# Patient Record
Sex: Female | Born: 1995 | Race: Black or African American | Hispanic: No | Marital: Single | Smoking: Light tobacco smoker
Health system: Southern US, Community
[De-identification: ages and names within clinical notes are randomized; demographics above are authoritative.]

## PROBLEM LIST (undated history)

## (undated) DIAGNOSIS — N926 Irregular menstruation, unspecified: Secondary | ICD-10-CM

## (undated) DIAGNOSIS — F32A Depression, unspecified: Secondary | ICD-10-CM

## (undated) DIAGNOSIS — F4321 Adjustment disorder with depressed mood: Secondary | ICD-10-CM

## (undated) DIAGNOSIS — N739 Female pelvic inflammatory disease, unspecified: Secondary | ICD-10-CM

## (undated) DIAGNOSIS — K5909 Other constipation: Secondary | ICD-10-CM

## (undated) DIAGNOSIS — L732 Hidradenitis suppurativa: Secondary | ICD-10-CM

## (undated) DIAGNOSIS — F329 Major depressive disorder, single episode, unspecified: Secondary | ICD-10-CM

## (undated) HISTORY — DX: Irregular menstruation, unspecified: N92.6

## (undated) HISTORY — DX: Depression, unspecified: F32.A

## (undated) HISTORY — DX: Hidradenitis suppurativa: L73.2

## (undated) HISTORY — DX: Other constipation: K59.09

## (undated) HISTORY — DX: Female pelvic inflammatory disease, unspecified: N73.9

## (undated) HISTORY — DX: Adjustment disorder with depressed mood: F43.21

## (undated) HISTORY — DX: Major depressive disorder, single episode, unspecified: F32.9

---
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
## 2016-07-05 ENCOUNTER — Ambulatory Visit: Payer: Medicaid Other | Admitting: Pediatrics

## 2016-08-13 ENCOUNTER — Ambulatory Visit: Payer: Medicaid Other | Admitting: Pediatrics

## 2016-09-14 ENCOUNTER — Encounter (HOSPITAL_COMMUNITY): Payer: Self-pay | Admitting: *Deleted

## 2016-09-14 ENCOUNTER — Emergency Department (HOSPITAL_COMMUNITY)
Admission: EM | Admit: 2016-09-14 | Discharge: 2016-09-14 | Disposition: A | Discharge status: Home / Self Care | Payer: Medicaid Other | Attending: Emergency Medicine | Admitting: Emergency Medicine

## 2016-09-14 DIAGNOSIS — Y939 Activity, unspecified: Secondary | ICD-10-CM | POA: Insufficient documentation

## 2016-09-14 DIAGNOSIS — F17200 Nicotine dependence, unspecified, uncomplicated: Secondary | ICD-10-CM | POA: Insufficient documentation

## 2016-09-14 DIAGNOSIS — K0889 Other specified disorders of teeth and supporting structures: Secondary | ICD-10-CM | POA: Insufficient documentation

## 2016-09-14 DIAGNOSIS — K0510 Chronic gingivitis, plaque induced: Secondary | ICD-10-CM

## 2016-09-14 DIAGNOSIS — Y998 Other external cause status: Secondary | ICD-10-CM | POA: Diagnosis not present

## 2016-09-14 DIAGNOSIS — S0993XA Unspecified injury of face, initial encounter: Secondary | ICD-10-CM | POA: Diagnosis present

## 2016-09-14 DIAGNOSIS — S0083XA Contusion of other part of head, initial encounter: Secondary | ICD-10-CM

## 2016-09-14 DIAGNOSIS — K0500 Acute gingivitis, plaque induced: Secondary | ICD-10-CM | POA: Diagnosis not present

## 2016-09-14 DIAGNOSIS — Y92410 Unspecified street and highway as the place of occurrence of the external cause: Secondary | ICD-10-CM | POA: Diagnosis not present

## 2016-09-14 NOTE — ED Notes (Signed)
Pt left prior to receiving d/c instructions and having VS rechecked. Pt was ambulatory, stable and NAD.
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
## 2016-12-12 ENCOUNTER — Ambulatory Visit: Payer: Self-pay | Admitting: Pediatrics

## 2016-12-26 ENCOUNTER — Encounter: Payer: Self-pay | Admitting: Family

## 2016-12-26 ENCOUNTER — Encounter: Payer: Self-pay | Admitting: *Deleted

## 2016-12-26 ENCOUNTER — Ambulatory Visit (INDEPENDENT_AMBULATORY_CARE_PROVIDER_SITE_OTHER): Payer: Medicaid Other | Admitting: Family

## 2016-12-26 ENCOUNTER — Other Ambulatory Visit: Payer: Self-pay

## 2016-12-26 VITALS — BP 122/78 | HR 80 | Ht 67.72 in | Wt 246.8 lb

## 2016-12-26 DIAGNOSIS — Z113 Encounter for screening for infections with a predominantly sexual mode of transmission: Secondary | ICD-10-CM | POA: Diagnosis not present

## 2016-12-26 DIAGNOSIS — Z3046 Encounter for surveillance of implantable subdermal contraceptive: Secondary | ICD-10-CM | POA: Diagnosis not present

## 2016-12-26 DIAGNOSIS — Z30017 Encounter for initial prescription of implantable subdermal contraceptive: Secondary | ICD-10-CM | POA: Diagnosis not present

## 2016-12-26 MED ORDER — ETONOGESTREL 68 MG ~~LOC~~ IMPL
68.0000 mg | DRUG_IMPLANT | Freq: Once | SUBCUTANEOUS | Status: AC
  Administered 2016-12-26: 68 mg via SUBCUTANEOUS

## 2016-12-26 NOTE — Progress Notes (Signed)
THIS RECORD MAY CONTAIN CONFIDENTIAL INFORMATION THAT SHOULD NOT BE RELEASED WITHOUT REVIEW OF THE SERVICE PROVIDER.  Adolescent Medicine Consultation Follow-Up Visit Megan Simmons  is a 21 y.o. female referred by Rockney Gheearnell, Elizabeth, MD here today for follow-up regarding nexplanon removal and reinsertion.    Last seen in [facility] on 10/21/ for prediabetes and nexplanon in place.  Pertinent Labs? No Growth Chart Viewed? no   History was provided by the patient.  Interpreter? no  PCP Confirmed?  yes  My Chart Activated?   no    Chief Complaint  Patient presents with  . Follow-up    HPI:    11/26/13 insertion of nexplanon.  Returns today for resinsertion.  Likes device.  Had some issues with BTB at first with device.  No vaginal discharge changes, no lesions, no dyspareunia at present.   Review of Systems  Constitutional: Negative for malaise/fatigue.  Eyes: Negative for double vision.  Respiratory: Negative for shortness of breath.   Cardiovascular: Negative for chest pain and palpitations.  Gastrointestinal: Negative for abdominal pain, constipation, diarrhea, nausea and vomiting.  Genitourinary: Negative for dysuria.  Musculoskeletal: Negative for joint pain and myalgias.  Skin: Negative for rash.  Neurological: Negative for dizziness and headaches.  Endo/Heme/Allergies: Does not bruise/bleed easily.    Patient's last menstrual period was 12/25/2016 (exact date). No Known Allergies Outpatient Medications Prior to Visit  Medication Sig Dispense Refill  . etonogestrel (NEXPLANON) 68 MG IMPL implant 1 each by Subdermal route once.    . chlorhexidine (HIBICLENS) 4 % external liquid Reported on 06/23/2015    . clindamycin (CLEOCIN T) 1 % external solution Reported on 06/23/2015  5  . DIFFERIN 0.1 % cream APPLY A THIN LAYER TO THE FACE NIGHTLY.  3  . doxycycline (VIBRAMYCIN) 100 MG capsule Take 100 mg by mouth 2 (two) times daily. Reported on 06/23/2015   5   No facility-administered medications prior to visit.      Patient Active Problem List   Diagnosis Date Noted  . Obesity 06/23/2015  . Hx: UTI (urinary tract infection) 06/23/2015  . Surveillance of implantable subdermal contraceptive 08/19/2014  . Vitamin D deficiency 03/26/2014  . Depressed mood 03/26/2014  . Hidradenitis suppurativa 10/07/2013  . Acne 12/16/2012    Physical Exam:  Vitals:   12/26/16 0952  BP: 122/78  Pulse: 80  Weight: 246 lb 12.8 oz (111.9 kg)  Height: 5' 7.72" (1.72 m)   BP 122/78 (BP Location: Left Arm, Patient Position: Sitting, Cuff Size: Large)   Pulse 80   Ht 5' 7.72" (1.72 m)   Wt 246 lb 12.8 oz (111.9 kg)   LMP 12/25/2016 (Exact Date)   BMI 37.84 kg/m  Body mass index: body mass index is 37.84 kg/m. Growth percentile SmartLinks can only be used for patients less than 20 years old.  Wt Readings from Last 3 Encounters:  12/26/16 246 lb 12.8 oz (111.9 kg)  06/23/15 259 lb 3.2 oz (117.6 kg) (>99 %, Z= 2.53)*  05/04/15 255 lb 14.4 oz (116.1 kg) (>99 %, Z= 2.50)*   * Growth percentiles are based on CDC (Girls, 2-20 Years) data.    Physical Exam  Constitutional: She appears well-developed. No distress.  HENT:  Head: Normocephalic and atraumatic.  Eyes: EOM are normal. Pupils are equal, round, and reactive to light. No scleral icterus.  Neck: Normal range of motion. Neck supple. No thyromegaly present.  Cardiovascular: Normal rate and regular rhythm.  No murmur heard. Pulmonary/Chest: Effort normal and  breath sounds normal.  Musculoskeletal: Normal range of motion. She exhibits no edema.  Lymphadenopathy:    She has no cervical adenopathy.  Neurological: She is alert. No cranial nerve deficit.  Skin: Skin is warm and dry. No rash noted.  Significant AN on neck and axillary folds  Psychiatric: She has a normal mood and affect.  Nursing note reviewed.  Assessment/Plan: 1. Encounter for Nexplanon removal Risks & benefits of  Nexplanon removal discussed. Consent form signed.  The patient denies any allergies to anesthetics or antiseptics.  Procedure: Pt was placed in supine position. right arm was flexed at the elbow and externally rotated so that her wrist was parallel to her ear, The device was palpated and marked. The site was cleaned with Betadine. The area surrounding the device was covered with a sterile drape. 1% lidocaine was injected just under the device. A scalpel was used to create a small incision. The device was pushed towards the incision. Fibrous tissue surrounding the device was gradually removed from the device. The device was removed and measured to ensure all 4 cm of device was removed. Steri-strips were used to close the incision. Pressure dressing was applied to the patient.  The patient was instructed to removed the pressure dressing in 24 hrs.  The patient was advised to move slowly from a supine to an upright position  The patient denied any concerns or complaints  The patient was instructed to schedule a follow-up appt in 1 month. The patient will be called in 1 week to address any concerns.  2. Nexplanon insertion See procedure note.  - Subdermal Etonogestrel Implant Insertion  3. Routine screening for STI (sexually transmitted infection) Per protocol - C. trachomatis/N. gonorrhoeae RNA   Follow-up:   One month s/p nexplanon insertion with Christianne Dolinhristy Millican, FNP-C.   Medical decision-making:  >15 minutes spent face to face with patient with more than 50% of appointment spent discussing diagnosis, management, follow-up, and reviewing removal and reinsertion procedures, bleeding profiles, and return precautions.

## 2016-12-26 NOTE — Procedures (Signed)
Nexplanon Insertion  No contraindications for placement.  No liver disease, no unexplained vaginal bleeding, no h/o breast cancer, no h/o blood clots.  Patient's last menstrual period was 12/25/2016 (exact date).  UHCG: negative  Last Unprotected sex:  N/A  Risks & benefits of Nexplanon discussed The nexplanon device was [facility] and [facility] Packaging instructions supplied to patient Consent form signed  The patient denies any allergies to anesthetics or antiseptics.  Procedure: Pt was placed in supine position. The right arm was flexed at the elbow and externally rotated so that her wrist was parallel to her ear The medial epicondyle of the right arm was identified The insertions site was marked 8 cm proximal to the medial epicondyle The insertion site was cleaned with Betadine The area surrounding the insertion site was covered with a sterile drape 1% lidocaine was injected just under the skin at the insertion site extending 4 cm proximally. The sterile preloaded disposable Nexaplanon applicator was removed from the sterile packaging The applicator needle was inserted at a 30 degree angle at 8 cm proximal to the medial epicondyle as marked The applicator was lowered to a horizontal position and advanced just under the skin for the full length of the needle The slider on the applicator was retracted fully while the applicator remained in the same position, then the applicator was removed. The implant was confirmed via palpation as being in position The implant position was demonstrated to the patient Pressure dressing was applied to the patient.  The patient was instructed to removed the pressure dressing in 24 hrs.  The patient was advised to move slowly from a supine to an upright position  The patient denied any concerns or complaints  The patient was instructed to schedule a follow-up appt in 1 month and to call sooner if any concerns.  The patient  acknowledged agreement and understanding of the plan.

## 2016-12-26 NOTE — Patient Instructions (Signed)
   Your Nexplanon was removed today and is no longer preventing pregnancy.  If you have sex, remember to use condoms to prevent pregnancy and to prevent sexually transmitted infections.  Leave the outside bandage on for 24 hours.  Leave the smaller bandages on for 3-5 days or until they fall off on their own.  Keep the area clean and dry for 3-5 days.  There is usually bruising or swelling at and around the removal site for a few days to a week after the removal.  If you see redness or pus draining from the removal site, call us immediately.  We would like you to return to the clinic for a follow-up visit in 1 month.  You can call [facility] 24 hours a day with any questions or concerns.  There is always a nurse or doctor available to take your call.  Call 9-1-1 if you have a life-threatening emergency.  For anything else, please call us at [phone] before heading to the ER.    Congratulations on getting your Nexplanon placement!  Below is some important information about Nexplanon.  First remember that Nexplanon does not prevent sexually transmitted infections.  Condoms will help prevent sexually transmitted infections. The Nexplanon starts working 7 days after it was inserted.  There is a risk of getting pregnant if you have unprotected sex in those first 7 days after placement of the Nexplanon.  The Nexplanon lasts for 3 years but can be removed at any time.  You can become pregnant as early as 1 week after removal.  You can have a new Nexplanon put in after the old one is removed if you like.  It is not known whether Nexplanon is as effective in women who are very overweight because the studies did not include many overweight women.  Nexplanon interacts with some medications, including barbiturates, bosentan, carbamazepine, felbamate, griseofulvin, oxcarbazepine, phenytoin, rifampin, St. John's wort, topiramate, HIV medicines.  Please alert your doctor if you are on  any of these medicines.  Always tell other healthcare providers that you have a Nexplanon in your arm.  The Nexplanon was placed just under the skin.  Leave the outside bandage on for 24 hours.  Leave the smaller bandage on for 3-5 days or until it falls off on its own.  Keep the area clean and dry for 3-5 days. There is usually bruising or swelling at the insertion site for a few days to a week after placement.  If you see redness or pus draining from the insertion site, call us immediately.  Keep your user card with the date the implant was placed and the date the implant is to be removed.  The most common side effect is a change in your menstrual bleeding pattern.   This bleeding is generally not harmful to you but can be annoying.  Call or come in to see us if you have any concerns about the bleeding or if you have any side effects or questions.    We will call you in 1 week to check in and we would like you to return to the clinic for a follow-up visit in 1 month.  You can call [facility] 24 hours a day with any questions or concerns.  There is always a nurse or doctor available to take your call.  Call 9-1-1 if you have a life-threatening emergency.  For anything else, please call us at [phone] before heading to the ER. 

## 2016-12-27 LAB — C. TRACHOMATIS/N. GONORRHOEAE RNA
C. TRACHOMATIS RNA, TMA: NOT DETECTED
N. GONORRHOEAE RNA, TMA: NOT DETECTED

## 2017-01-18 ENCOUNTER — Ambulatory Visit: Payer: Medicaid Other | Admitting: Family

## 2017-02-08 ENCOUNTER — Ambulatory Visit: Payer: Medicaid Other | Admitting: Family

## 2017-02-11 ENCOUNTER — Ambulatory Visit (INDEPENDENT_AMBULATORY_CARE_PROVIDER_SITE_OTHER): Payer: Medicaid Other | Admitting: Pediatrics

## 2017-02-11 ENCOUNTER — Encounter: Payer: Self-pay | Admitting: Pediatrics

## 2017-02-11 VITALS — BP 129/77 | HR 103 | Ht 68.0 in | Wt 248.0 lb

## 2017-02-11 DIAGNOSIS — N898 Other specified noninflammatory disorders of vagina: Secondary | ICD-10-CM

## 2017-02-11 DIAGNOSIS — L732 Hidradenitis suppurativa: Secondary | ICD-10-CM

## 2017-02-11 DIAGNOSIS — Z113 Encounter for screening for infections with a predominantly sexual mode of transmission: Secondary | ICD-10-CM | POA: Diagnosis not present

## 2017-02-11 MED ORDER — CHLORHEXIDINE GLUCONATE 4 % EX LIQD: Freq: Every day | CUTANEOUS | 3 refills | Status: DC | PRN

## 2017-02-11 MED ORDER — CLINDAMYCIN PHOSPHATE 1 % EX SOLN: Freq: Two times a day (BID) | CUTANEOUS | 5 refills | Status: DC

## 2017-02-11 NOTE — Progress Notes (Signed)
THIS RECORD MAY CONTAIN CONFIDENTIAL INFORMATION THAT SHOULD NOT BE RELEASED WITHOUT REVIEW OF THE SERVICE PROVIDER.  Adolescent Medicine Consultation Follow-Up Visit Megan Simmons  is a 22 y.o. female referred by [name], MD here today for follow-up regarding nexplanon replacement.    Last seen in Adolescent Medicine Clinic on 12/26/16 for nexplanon remove and replace.  Plan at last visit included see procedure note.  Pertinent Labs? No Growth Chart Viewed? yes   History was provided by the patient.  Interpreter? no  PCP Confirmed?  yes  My Chart Activated?   no   Chief Complaint  Patient presents with  . Follow-up    HPI:   Having some itching at the site of insertion. Wondering when discoloration will go away.  Had sex a few weeks ago and had some significant pain and then "wondered if her uterus would fall out." had sex again with a different partner recently and it was not painful. She is afraid that perhaps she got an STI as first encounter was not protected. Requests pelvic exam today.   Review of Systems  Constitutional: Negative for malaise/fatigue.  Eyes: Negative for double vision.  Respiratory: Negative for shortness of breath.   Cardiovascular: Negative for chest pain and palpitations.  Gastrointestinal: Negative for abdominal pain, constipation, diarrhea, nausea and vomiting.  Genitourinary: Negative for dysuria.  Musculoskeletal: Negative for joint pain and myalgias.  Skin: Negative for rash.  Neurological: Negative for dizziness and headaches.  Endo/Heme/Allergies: Does not bruise/bleed easily.     No LMP recorded. Patient has had an implant. No Known Allergies Outpatient Medications Prior to Visit  Medication Sig Dispense Refill  . etonogestrel (NEXPLANON) 68 MG IMPL implant 1 each by Subdermal route once.    . chlorhexidine (HIBICLENS) 4 % external liquid Reported on 06/23/2015    . clindamycin (CLEOCIN T) 1 % external solution Reported  on 06/23/2015  5  . DIFFERIN 0.1 % cream APPLY A THIN LAYER TO THE FACE NIGHTLY.  3  . doxycycline (VIBRAMYCIN) 100 MG capsule Take 100 mg by mouth 2 (two) times daily. Reported on 06/23/2015  5   No facility-administered medications prior to visit.      Patient Active Problem List   Diagnosis Date Noted  . Obesity 06/23/2015  . Hx: UTI (urinary tract infection) 06/23/2015  . Surveillance of implantable subdermal contraceptive 08/19/2014  . Vitamin D deficiency 03/26/2014  . Depressed mood 03/26/2014  . Hidradenitis suppurativa 10/07/2013  . Acne 12/16/2012     The following portions of the patient's history were reviewed and updated as appropriate: allergies, current medications, past family history, past medical history, past social history, past surgical history and problem list.  Physical Exam:  Vitals:   02/11/17 1626  BP: 129/77  Pulse: (!) 103  Weight: 248 lb (112.5 kg)  Height: 5\' 8"  (1.727 m)   BP 129/77   Pulse (!) 103   Ht 5\' 8"  (1.727 m)   Wt 248 lb (112.5 kg)   BMI 37.71 kg/m  Body mass index: body mass index is 37.71 kg/m. Growth percentile SmartLinks can only be used for patients less than 55 years old.   Physical Exam  Constitutional: She appears well-developed. No distress.  HENT:  Mouth/Throat: Oropharynx is clear and moist.  Neck: No thyromegaly present.  Cardiovascular: Normal rate and regular rhythm.  No murmur heard. Pulmonary/Chest: Breath sounds normal.  Abdominal: Soft. She exhibits no mass. There is no tenderness. There is no guarding.  Genitourinary: Cervix exhibits  no motion tenderness and no discharge. Right adnexum displays no tenderness. Left adnexum displays no tenderness. No vaginal discharge found.  Musculoskeletal: She exhibits no edema.  Lymphadenopathy:    She has no cervical adenopathy.  Neurological: She is alert.  Skin: Skin is warm. No rash noted.  Psychiatric: She has a normal mood and affect.  Nursing note and vitals  reviewed.   Assessment/Plan: 1. Routine screening for STI (sexually transmitted infection) Per protocol. Some bumps likely from shaving but will ensure not HSV.  - C. trachomatis/N. gonorrhoeae RNA - Herpes simplex virus culture  2. Vaginal discharge Not very significant on exam but will swab and treat as necessary. No CMT. - WET PREP BY MOLECULAR PROBE  3. Hidradenitis suppurativa Refilled meds for hidradenitis. Has some lesions on inner thighs.  - clindamycin (CLEOCIN T) 1 % external solution; Apply topically 2 (two) times daily.  Dispense: 60 mL; Refill: 5 - chlorhexidine (HIBICLENS) 4 % external liquid; Apply topically daily as needed. Reported on 06/23/2015  Dispense: 473 mL; Refill: 3   Follow-up:  As needed   Medical decision-making:  >25 minutes spent face to face with patient with more than 50% of appointment spent discussing diagnosis, management, follow-up, and reviewing of vaginal discharge, painful intercourse, hidradenitis.

## 2017-02-11 NOTE — Patient Instructions (Signed)
Start using clindamycin lotion and cleanser again to spots on legs  I will call you tomorrow or Wednesday with results

## 2017-02-12 LAB — WET PREP BY MOLECULAR PROBE
Candida species: NOT DETECTED
MICRO NUMBER: 90116220
SPECIMEN QUALITY: ADEQUATE
TRICHOMONAS VAG: NOT DETECTED

## 2017-02-12 LAB — C. TRACHOMATIS/N. GONORRHOEAE RNA
C. trachomatis RNA, TMA: NOT DETECTED
N. GONORRHOEAE RNA, TMA: NOT DETECTED

## 2017-02-12 LAB — TIQ- AMBIGUOUS ORDER

## 2017-02-13 LAB — HERPES SIMPLEX VIRUS CULTURE
MICRO NUMBER: 90116236
SPECIMEN QUALITY: ADEQUATE

## 2018-09-20 ENCOUNTER — Other Ambulatory Visit: Payer: Self-pay

## 2018-09-20 ENCOUNTER — Inpatient Hospital Stay (HOSPITAL_COMMUNITY)
Admission: EM | Admit: 2018-09-20 | Discharge: 2018-09-22 | DRG: 983 | Disposition: A | Discharge status: Home / Self Care | Payer: Medicaid Other | Attending: Family Medicine | Admitting: Family Medicine

## 2018-09-20 ENCOUNTER — Observation Stay (HOSPITAL_COMMUNITY): Payer: Medicaid Other | Admitting: Anesthesiology

## 2018-09-20 ENCOUNTER — Encounter (HOSPITAL_COMMUNITY)
Admission: EM | Disposition: A | Discharge status: Home / Self Care | Payer: Self-pay | Source: Home / Self Care | Attending: Family Medicine

## 2018-09-20 ENCOUNTER — Emergency Department (HOSPITAL_COMMUNITY): Payer: Medicaid Other

## 2018-09-20 ENCOUNTER — Encounter (HOSPITAL_COMMUNITY): Payer: Self-pay

## 2018-09-20 DIAGNOSIS — M659 Synovitis and tenosynovitis, unspecified: Secondary | ICD-10-CM | POA: Diagnosis present

## 2018-09-20 DIAGNOSIS — Z818 Family history of other mental and behavioral disorders: Secondary | ICD-10-CM

## 2018-09-20 DIAGNOSIS — Z8249 Family history of ischemic heart disease and other diseases of the circulatory system: Secondary | ICD-10-CM

## 2018-09-20 DIAGNOSIS — L732 Hidradenitis suppurativa: Secondary | ICD-10-CM | POA: Diagnosis present

## 2018-09-20 DIAGNOSIS — Z825 Family history of asthma and other chronic lower respiratory diseases: Secondary | ICD-10-CM

## 2018-09-20 DIAGNOSIS — L02511 Cutaneous abscess of right hand: Principal | ICD-10-CM | POA: Diagnosis present

## 2018-09-20 DIAGNOSIS — F17200 Nicotine dependence, unspecified, uncomplicated: Secondary | ICD-10-CM | POA: Diagnosis present

## 2018-09-20 DIAGNOSIS — Z20828 Contact with and (suspected) exposure to other viral communicable diseases: Secondary | ICD-10-CM | POA: Diagnosis present

## 2018-09-20 DIAGNOSIS — R7303 Prediabetes: Secondary | ICD-10-CM | POA: Diagnosis present

## 2018-09-20 DIAGNOSIS — N739 Female pelvic inflammatory disease, unspecified: Secondary | ICD-10-CM | POA: Diagnosis present

## 2018-09-20 DIAGNOSIS — L089 Local infection of the skin and subcutaneous tissue, unspecified: Secondary | ICD-10-CM

## 2018-09-20 HISTORY — PX: I&D EXTREMITY: SHX5045

## 2018-09-20 LAB — CBC WITH DIFFERENTIAL/PLATELET
Abs Immature Granulocytes: 0.06 10*3/uL (ref 0.00–0.07)
Basophils Absolute: 0 10*3/uL (ref 0.0–0.1)
Basophils Relative: 0 %
Eosinophils Absolute: 0.1 10*3/uL (ref 0.0–0.5)
Eosinophils Relative: 1 %
HCT: 40.9 % (ref 36.0–46.0)
Hemoglobin: 13 g/dL (ref 12.0–15.0)
Immature Granulocytes: 0 %
Lymphocytes Relative: 23 %
Lymphs Abs: 3.4 10*3/uL (ref 0.7–4.0)
MCH: 28.4 pg (ref 26.0–34.0)
MCHC: 31.8 g/dL (ref 30.0–36.0)
MCV: 89.5 fL (ref 80.0–100.0)
Monocytes Absolute: 1 10*3/uL (ref 0.1–1.0)
Monocytes Relative: 7 %
Neutro Abs: 10.1 10*3/uL — ABNORMAL HIGH (ref 1.7–7.7)
Neutrophils Relative %: 69 %
Platelets: 329 10*3/uL (ref 150–400)
RBC: 4.57 MIL/uL (ref 3.87–5.11)
RDW: 12.9 % (ref 11.5–15.5)
WBC: 14.7 10*3/uL — ABNORMAL HIGH (ref 4.0–10.5)
nRBC: 0 % (ref 0.0–0.2)

## 2018-09-20 LAB — BASIC METABOLIC PANEL
Anion gap: 5 (ref 5–15)
BUN: 8 mg/dL (ref 6–20)
CO2: 28 mmol/L (ref 22–32)
Calcium: 9.1 mg/dL (ref 8.9–10.3)
Chloride: 107 mmol/L (ref 98–111)
Creatinine, Ser: 0.73 mg/dL (ref 0.44–1.00)
GFR calc Af Amer: >60 mL/min (ref >60)
GFR calc non Af Amer: >60 mL/min (ref >60)
Glucose, Bld: 99 mg/dL (ref 70–99)
Potassium: 3.7 mmol/L (ref 3.5–5.1)
Sodium: 140 mmol/L (ref 135–145)

## 2018-09-20 LAB — SARS CORONAVIRUS 2 BY RT PCR (HOSPITAL ORDER, PERFORMED IN ~~LOC~~ HOSPITAL LAB): SARS Coronavirus 2: NEGATIVE

## 2018-09-20 LAB — I-STAT BETA HCG BLOOD, ED (MC, WL, AP ONLY): I-stat hCG, quantitative: <5 m[IU]/mL (ref <5)

## 2018-09-20 SURGERY — IRRIGATION AND DEBRIDEMENT EXTREMITY
Anesthesia: General | Site: Finger | Laterality: Right

## 2018-09-20 MED ORDER — ONDANSETRON HCL 4 MG/2ML IJ SOLN
INTRAMUSCULAR | Status: DC | PRN
  Administered 2018-09-20: 4 mg via INTRAVENOUS

## 2018-09-20 MED ORDER — FENTANYL CITRATE (PF) 250 MCG/5ML IJ SOLN
INTRAMUSCULAR | Status: DC | PRN
  Administered 2018-09-20: 100 ug via INTRAVENOUS
  Administered 2018-09-20: 50 ug via INTRAVENOUS

## 2018-09-20 MED ORDER — EPHEDRINE 5 MG/ML INJ
INTRAVENOUS | Status: AC
  Filled 2018-09-20: qty 10

## 2018-09-20 MED ORDER — PHENYLEPHRINE 40 MCG/ML (10ML) SYRINGE FOR IV PUSH (FOR BLOOD PRESSURE SUPPORT)
PREFILLED_SYRINGE | INTRAVENOUS | Status: AC
  Filled 2018-09-20: qty 10

## 2018-09-20 MED ORDER — METHOCARBAMOL 500 MG PO TABS
500.0000 mg | ORAL_TABLET | Freq: Four times a day (QID) | ORAL | Status: DC | PRN
  Administered 2018-09-21 (×2): 500 mg via ORAL
  Filled 2018-09-20 (×2): qty 1

## 2018-09-20 MED ORDER — ROCURONIUM BROMIDE 10 MG/ML (PF) SYRINGE
PREFILLED_SYRINGE | INTRAVENOUS | Status: AC
  Filled 2018-09-20: qty 20

## 2018-09-20 MED ORDER — KETOROLAC TROMETHAMINE 30 MG/ML IJ SOLN: 30.0000 mg | Freq: Once | INTRAMUSCULAR | Status: DC | PRN

## 2018-09-20 MED ORDER — FENTANYL CITRATE (PF) 250 MCG/5ML IJ SOLN
INTRAMUSCULAR | Status: AC
  Filled 2018-09-20: qty 5

## 2018-09-20 MED ORDER — ONDANSETRON HCL 4 MG/2ML IJ SOLN
INTRAMUSCULAR | Status: AC
  Filled 2018-09-20: qty 2

## 2018-09-20 MED ORDER — VANCOMYCIN HCL 10 G IV SOLR
1500.0000 mg | Freq: Three times a day (TID) | INTRAVENOUS | Status: DC
  Filled 2018-09-20 (×3): qty 1500

## 2018-09-20 MED ORDER — MIDAZOLAM HCL 2 MG/2ML IJ SOLN
INTRAMUSCULAR | Status: AC
  Filled 2018-09-20: qty 2

## 2018-09-20 MED ORDER — DEXAMETHASONE SODIUM PHOSPHATE 10 MG/ML IJ SOLN
INTRAMUSCULAR | Status: AC
  Filled 2018-09-20: qty 1

## 2018-09-20 MED ORDER — ACETAMINOPHEN 325 MG PO TABS
650.0000 mg | ORAL_TABLET | Freq: Once | ORAL | Status: AC
Start: 2018-09-20 — End: 2018-09-20
  Administered 2018-09-20: 15:00:00 650 mg via ORAL
  Filled 2018-09-20: qty 2

## 2018-09-20 MED ORDER — PROMETHAZINE HCL 25 MG/ML IJ SOLN: 6.2500 mg | INTRAMUSCULAR | Status: DC | PRN

## 2018-09-20 MED ORDER — LACTATED RINGERS IV SOLN
INTRAVENOUS | Status: DC | PRN
  Administered 2018-09-20: 19:00:00 via INTRAVENOUS

## 2018-09-20 MED ORDER — KETOROLAC TROMETHAMINE 30 MG/ML IJ SOLN
30.0000 mg | Freq: Four times a day (QID) | INTRAMUSCULAR | Status: DC | PRN
  Administered 2018-09-21: 30 mg via INTRAVENOUS
  Filled 2018-09-20: qty 1

## 2018-09-20 MED ORDER — PROPOFOL 10 MG/ML IV BOLUS
INTRAVENOUS | Status: AC
  Filled 2018-09-20: qty 20

## 2018-09-20 MED ORDER — HYDROMORPHONE HCL 1 MG/ML IJ SOLN
0.2500 mg | INTRAMUSCULAR | Status: DC | PRN
  Administered 2018-09-20 (×2): 0.5 mg via INTRAVENOUS

## 2018-09-20 MED ORDER — OXYCODONE HCL 5 MG PO TABS: 5.0000 mg | ORAL_TABLET | Freq: Once | ORAL | Status: DC | PRN

## 2018-09-20 MED ORDER — SUCCINYLCHOLINE 20MG/ML (10ML) SYRINGE FOR MEDFUSION PUMP - OPTIME
INTRAMUSCULAR | Status: DC | PRN
  Administered 2018-09-20: 120 mg via INTRAVENOUS

## 2018-09-20 MED ORDER — OXYCODONE HCL 5 MG/5ML PO SOLN: 5.0000 mg | Freq: Once | ORAL | Status: DC | PRN

## 2018-09-20 MED ORDER — ROCURONIUM BROMIDE 10 MG/ML (PF) SYRINGE
PREFILLED_SYRINGE | INTRAVENOUS | Status: AC
  Filled 2018-09-20: qty 10

## 2018-09-20 MED ORDER — VANCOMYCIN HCL 10 G IV SOLR
1500.0000 mg | Freq: Two times a day (BID) | INTRAVENOUS | Status: DC
  Administered 2018-09-21: 04:00:00 1500 mg via INTRAVENOUS
  Filled 2018-09-20 (×3): qty 1500

## 2018-09-20 MED ORDER — SUCCINYLCHOLINE CHLORIDE 200 MG/10ML IV SOSY
PREFILLED_SYRINGE | INTRAVENOUS | Status: AC
  Filled 2018-09-20: qty 10

## 2018-09-20 MED ORDER — PROPOFOL 10 MG/ML IV BOLUS
INTRAVENOUS | Status: DC | PRN
  Administered 2018-09-20: 200 mg via INTRAVENOUS

## 2018-09-20 MED ORDER — DEXAMETHASONE SODIUM PHOSPHATE 10 MG/ML IJ SOLN
INTRAMUSCULAR | Status: DC | PRN
  Administered 2018-09-20: 10 mg via INTRAVENOUS

## 2018-09-20 MED ORDER — VANCOMYCIN HCL IN DEXTROSE 1-5 GM/200ML-% IV SOLN
1000.0000 mg | Freq: Once | INTRAVENOUS | Status: AC
  Administered 2018-09-20: 1000 mg via INTRAVENOUS
  Filled 2018-09-20: qty 200

## 2018-09-20 MED ORDER — ACETAMINOPHEN 325 MG PO TABS: 650.0000 mg | ORAL_TABLET | Freq: Four times a day (QID) | ORAL | Status: DC | PRN

## 2018-09-20 MED ORDER — LIDOCAINE HCL (CARDIAC) PF 100 MG/5ML IV SOSY
PREFILLED_SYRINGE | INTRAVENOUS | Status: DC | PRN
  Administered 2018-09-20: 80 mg via INTRATRACHEAL

## 2018-09-20 MED ORDER — MIDAZOLAM HCL 2 MG/2ML IJ SOLN
INTRAMUSCULAR | Status: DC | PRN
  Administered 2018-09-20: 2 mg via INTRAVENOUS

## 2018-09-20 MED ORDER — LIDOCAINE 2% (20 MG/ML) 5 ML SYRINGE
INTRAMUSCULAR | Status: AC
  Filled 2018-09-20: qty 5

## 2018-09-20 MED ORDER — OXYCODONE HCL 5 MG PO TABS
10.0000 mg | ORAL_TABLET | ORAL | Status: DC | PRN
  Administered 2018-09-21 – 2018-09-22 (×4): 10 mg via ORAL
  Filled 2018-09-20 (×4): qty 2

## 2018-09-20 MED ORDER — HYDROMORPHONE HCL 1 MG/ML IJ SOLN
INTRAMUSCULAR | Status: AC
  Administered 2018-09-20: 0.5 mg via INTRAVENOUS
  Filled 2018-09-20: qty 1

## 2018-09-20 MED ORDER — VANCOMYCIN HCL IN DEXTROSE 1-5 GM/200ML-% IV SOLN
1000.0000 mg | Freq: Once | INTRAVENOUS | Status: DC
  Filled 2018-09-20: qty 200

## 2018-09-20 SURGICAL SUPPLY — 38 items
BNDG CMPR MD 5X2 ELC HKLP STRL (GAUZE/BANDAGES/DRESSINGS) ×1
BNDG COHESIVE 3X5 TAN STRL LF (GAUZE/BANDAGES/DRESSINGS) ×2 IMPLANT
BNDG CONFORM 2 STRL LF (GAUZE/BANDAGES/DRESSINGS) ×2 IMPLANT
BNDG CONFORM 3 STRL LF (GAUZE/BANDAGES/DRESSINGS) ×2 IMPLANT
BNDG ELASTIC 2 VLCR STRL LF (GAUZE/BANDAGES/DRESSINGS) ×2 IMPLANT
BNDG ELASTIC 4X5.8 VLCR STR LF (GAUZE/BANDAGES/DRESSINGS) ×3 IMPLANT
BNDG GAUZE ELAST 4 BULKY (GAUZE/BANDAGES/DRESSINGS) ×9 IMPLANT
CORD BIPOLAR FORCEPS 12FT (ELECTRODE) ×3 IMPLANT
COVER SURGICAL LIGHT HANDLE (MISCELLANEOUS) ×3 IMPLANT
DRAIN PENROSE 1/4X12 LTX STRL (WOUND CARE) ×2 IMPLANT
DRSG ADAPTIC 3X8 NADH LF (GAUZE/BANDAGES/DRESSINGS) ×3 IMPLANT
GAUZE SPONGE 4X4 12PLY STRL (GAUZE/BANDAGES/DRESSINGS) ×3 IMPLANT
GAUZE SPONGE 4X4 12PLY STRL LF (GAUZE/BANDAGES/DRESSINGS) ×2 IMPLANT
GAUZE XEROFORM 1X8 LF (GAUZE/BANDAGES/DRESSINGS) ×3 IMPLANT
GLOVE BIOGEL M 8.0 STRL (GLOVE) ×3 IMPLANT
GLOVE SS BIOGEL STRL SZ 8 (GLOVE) ×1 IMPLANT
GLOVE SUPERSENSE BIOGEL SZ 8 (GLOVE) ×2
GOWN STRL REUS W/ TWL LRG LVL3 (GOWN DISPOSABLE) ×1 IMPLANT
GOWN STRL REUS W/ TWL XL LVL3 (GOWN DISPOSABLE) ×2 IMPLANT
GOWN STRL REUS W/TWL LRG LVL3 (GOWN DISPOSABLE) ×3
GOWN STRL REUS W/TWL XL LVL3 (GOWN DISPOSABLE) ×6
KIT BASIN OR (CUSTOM PROCEDURE TRAY) ×3 IMPLANT
KIT TURNOVER KIT B (KITS) ×3 IMPLANT
MANIFOLD NEPTUNE II (INSTRUMENTS) ×3 IMPLANT
NS IRRIG 1000ML POUR BTL (IV SOLUTION) ×3 IMPLANT
PACK ORTHO EXTREMITY (CUSTOM PROCEDURE TRAY) ×3 IMPLANT
PAD ARMBOARD 7.5X6 YLW CONV (MISCELLANEOUS) ×3 IMPLANT
PAD CAST 4YDX4 CTTN HI CHSV (CAST SUPPLIES) ×1 IMPLANT
PADDING CAST COTTON 4X4 STRL (CAST SUPPLIES) ×3
SET CYSTO W/LG BORE CLAMP LF (SET/KITS/TRAYS/PACK) ×3 IMPLANT
SOL PREP POV-IOD 4OZ 10% (MISCELLANEOUS) ×6 IMPLANT
SWAB COLLECTION DEVICE MRSA (MISCELLANEOUS) ×2 IMPLANT
SWAB CULTURE ESWAB REG 1ML (MISCELLANEOUS) ×3 IMPLANT
TOWEL GREEN STERILE (TOWEL DISPOSABLE) ×3 IMPLANT
TOWEL GREEN STERILE FF (TOWEL DISPOSABLE) ×3 IMPLANT
TUBE CONNECTING 12'X1/4 (SUCTIONS) ×1
TUBE CONNECTING 12X1/4 (SUCTIONS) ×2 IMPLANT
YANKAUER SUCT BULB TIP NO VENT (SUCTIONS) ×3 IMPLANT

## 2018-09-20 NOTE — Anesthesia Preprocedure Evaluation (Addendum)
Anesthesia Evaluation  Patient identified by MRN, date of birth, ID band Patient awake    Reviewed: Allergy & Precautions, NPO status , Patient's Chart, lab work & pertinent test results  Airway Mallampati: II  TM Distance: >3 FB Neck ROM: Full    Dental no notable dental hx.    Pulmonary Current Smoker and Patient abstained from smoking.,    Pulmonary exam normal breath sounds clear to auscultation       Cardiovascular negative cardio ROS Normal cardiovascular exam Rhythm:Regular Rate:Normal     Neuro/Psych PSYCHIATRIC DISORDERS Depression negative neurological ROS     GI/Hepatic negative GI ROS, (+)     substance abuse  ,   Endo/Other  negative endocrine ROS  Renal/GU negative Renal ROS     Musculoskeletal negative musculoskeletal ROS (+)   Abdominal   Peds  Hematology negative hematology ROS (+)   Anesthesia Other Findings infection of hand  Reproductive/Obstetrics hcg negative                            Anesthesia Physical Anesthesia Plan  ASA: II and emergent  Anesthesia Plan: General   Post-op Pain Management:    Induction: Intravenous and Rapid sequence  PONV Risk Score and Plan: 2 and Ondansetron, Dexamethasone, Midazolam and Treatment may vary due to age or medical condition  Airway Management Planned: Oral ETT  Additional Equipment:   Intra-op Plan:   Post-operative Plan: Extubation in OR  Informed Consent: I have reviewed the patients History and Physical, chart, labs and discussed the procedure including the risks, benefits and alternatives for the proposed anesthesia with the patient or authorized representative who has indicated his/her understanding and acceptance.     Dental advisory given  Plan Discussed with: CRNA  Anesthesia Plan Comments:        Anesthesia Quick Evaluation

## 2018-09-20 NOTE — ED Provider Notes (Signed)
[facility] Provider Note   CSN: [id number] Arrival date & time: 09/20/18  1255     History   Chief Complaint Chief Complaint  Patient presents with  . Hand Pain    HPI Megan Simmons is a right hand dominant 23 y.o. female with past medical history as listed below presents emergency department today with chief complaint of finger pain.  Pain is located on her right index finger and been present x2 days.  Patient states last night when she got off work she noticed that her finger was red and swollen. It is sore when trying to move her finger.   She does have a spot that appears to be a wart that is been present x1 year in the same finger.  She has never been evaluated for as it has not caused her any discomfort.  She did not take anything for pain prior to arrival.  Pt does report she was using shears to clip her hair 2 days ago.  She might have hit her index finger on the shears.  She denies any bleeding but does have a small superficial skin tear on the side of her right index finger. She denies any fever, chills, numbness, weakness, tingling.  Last p.o. intake was dinner yesterday.  Past Medical History:  Diagnosis Date  . Adjustment disorder with depressed mood 02/13/11   referred for counseling  . Constipation, chronic   . Depression   . Hidradenitis suppurativa    Mom not totally sure but described problem consistent with this diagnosis.  . Irregular menses   . Pelvic inflammatory disease (PID) 10/07/2013    Patient Active Problem List   Diagnosis Date Noted  . Obesity 06/23/2015  . Hx: UTI (urinary tract infection) 06/23/2015  . Surveillance of implantable subdermal contraceptive 08/19/2014  . Vitamin D deficiency 03/26/2014  . Depressed mood 03/26/2014  . Hidradenitis suppurativa 10/07/2013  . Acne 12/16/2012    Past Surgical History:  Procedure Laterality Date  . INCISION AND DRAINAGE ABSCESS Right 05/28/2013   Procedure:  INCISION AND DRAINAGE SACRAL ABSCESS;  Surgeon: Judie PetitM. Leonia CoronaShuaib Farooqui, MD;  Location: [facility];  Service: Pediatrics;  Laterality: Right;     OB History   No obstetric history on file.      Home Medications    Prior to Admission medications   Medication Sig Start Date End Date Taking? Authorizing Provider  chlorhexidine (HIBICLENS) 4 % external liquid Apply topically daily as needed. Reported on 06/23/2015 02/11/17   Verneda SkillHacker, Caroline T, FNP  clindamycin (CLEOCIN T) 1 % external solution Apply topically 2 (two) times daily. 02/11/17   Verneda SkillHacker, Caroline T, FNP  DIFFERIN 0.1 % cream APPLY A THIN LAYER TO THE FACE NIGHTLY. 02/04/15   [provider]  doxycycline (VIBRAMYCIN) 100 MG capsule Take 100 mg by mouth 2 (two) times daily. Reported on 06/23/2015 02/04/15   [provider]  etonogestrel (NEXPLANON) 68 MG IMPL implant 1 each by Subdermal route once.    [provider]    Family History Family History  Problem Relation Age of Onset  . Asthma Mother   . Hypertension Mother   . ADD / ADHD Mother   . Eczema Mother   . Diabetes Other   . Hypertension Other     Social History Social History   Tobacco Use  . Smoking status: Light Tobacco Smoker  . Smokeless tobacco: Never Used  Substance Use Topics  . Alcohol use: Yes  Alcohol/week: 0.0 standard drinks    Comment: OCC  . Drug use: Yes    Types: Marijuana     Allergies   Patient has no known allergies.   Review of Systems Review of Systems  Constitutional: Negative for chills and fever.  Cardiovascular: Negative for chest pain.  Gastrointestinal: Negative for abdominal pain, nausea and vomiting.  Musculoskeletal: Positive for arthralgias and joint swelling.  Skin: Positive for wound.  Allergic/Immunologic: Negative for immunocompromised state.  Neurological: Negative for weakness and numbness.     Physical Exam Updated Vital Signs BP 135/79 (BP Location: Right Arm)   Pulse  69   Temp 98.5 F (36.9 C) (Oral)   Resp 17   LMP 09/10/2018   SpO2 100%   Physical Exam Vitals signs and nursing note reviewed.  Constitutional:      General: She is not in acute distress.    Appearance: She is not ill-appearing.  HENT:     Head: Normocephalic and atraumatic.     Right Ear: Tympanic membrane and external ear normal.     Left Ear: Tympanic membrane and external ear normal.     Nose: Nose normal.     Mouth/Throat:     Mouth: Mucous membranes are moist.     Pharynx: Oropharynx is clear.  Eyes:     General: No scleral icterus.       Right eye: No discharge.        Left eye: No discharge.     Extraocular Movements: Extraocular movements intact.     Conjunctiva/sclera: Conjunctivae normal.     Pupils: Pupils are equal, round, and reactive to light.  Neck:     Musculoskeletal: Normal range of motion.     Vascular: No JVD.  Cardiovascular:     Rate and Rhythm: Normal rate and regular rhythm.     Pulses: Normal pulses.          Radial pulses are 2+ on the right side and 2+ on the left side.     Heart sounds: Normal heart sounds.  Pulmonary:     Comments: Lungs clear to auscultation in all fields. Symmetric chest rise. No wheezing, rales, or rhonchi. Abdominal:     Comments: Abdomen is soft, non-distended, and non-tender in all quadrants. No rigidity, no guarding. No peritoneal signs.  Musculoskeletal: Normal range of motion.     Comments: Index finger on right hand is red and swollen.  Tender to palpation. Decreased range of motion. Pain with extension of finger. Please see attached picture below.  Skin:    General: Skin is warm and dry.     Capillary Refill: Capillary refill takes less than 2 seconds.  Neurological:     Mental Status: She is oriented to person, place, and time.     GCS: GCS eye subscore is 4. GCS verbal subscore is 5. GCS motor subscore is 6.     Comments: Fluent speech, no facial droop.  Psychiatric:        Behavior: Behavior normal.           ED Treatments / Results  Labs (all labs ordered are listed, but only abnormal results are displayed) Labs Reviewed  CBC WITH DIFFERENTIAL/PLATELET - Abnormal; Notable for the following components:      Result Value   WBC 14.7 (*)    Neutro Abs 10.1 (*)    All other components within normal limits  SARS CORONAVIRUS 2 (HOSPITAL ORDER, PERFORMED IN [facility])  CULTURE, BLOOD (  ROUTINE X 2)  CULTURE, BLOOD (ROUTINE X 2)  BASIC METABOLIC PANEL  I-STAT BETA HCG BLOOD, ED ([facility], [facility], [facility] ONLY)    EKG None  Radiology Dg Finger Index Right  Result Date: 09/20/2018 CLINICAL DATA:  Pt states R index finger begun to swell from tip of DIP to MCP joint area x yesterday. Patient reports R finger pain since earlier this year, however noticed new swelling and redness extending from distal finger to hand yesterday at work. Limited range of motion. EXAM: RIGHT INDEX FINGER 2+V COMPARISON:  None. FINDINGS: There is soft tissue swelling of the index finger. Soft tissue irregularity is identified along the volar aspect of the distal interphalangeal joint. No radiopaque foreign body or soft tissue gas. No acute fracture or osseous erosions. IMPRESSION: Soft tissue swelling of the index finger. Electronically Signed   By: Nolon Nations M.D.   On: 09/20/2018 14:07    Procedures Procedures (including critical care time)  Medications Ordered in ED Medications  vancomycin (VANCOCIN) IVPB 1000 mg/200 mL premix (has no administration in time range)  acetaminophen (TYLENOL) tablet 650 mg (650 mg Oral Given 09/20/18 1449)     Initial Impression / Assessment and Plan / ED Course  I have reviewed the triage vital signs and the nursing notes.  Pertinent labs & imaging results that were available during my care of the patient were reviewed by me and considered in my medical decision making (see chart for details).  Patient is seen and examined.  She is afebrile, overall well-appearing, no  acute distress.  She has swelling, tenderness, erythema to right index finger. No open wound. She has pain with extension.  Finger xray viewed by me is negative for any signs of fracture or dislocation.  Patient does have a leukocytosis of 14.7 with left shift.  Pt discussed with and evaluated by ED attending Dr. Billy Fischer, we have concern for flexor tenosynovitis vs cellulitis lymphangitis.  Case discussed with hand surgeon Dr. Amedeo Plenty who recommends medical admission and IV vancomycin. Possible OR for debridement and washout.  Spoke with teaching service who agrees to assume care of patient and bring into the hospital for further evaluation and management.    This note was prepared using Dragon voice recognition software and may include unintentional dictation errors due to the inherent limitations of voice recognition software.   Final Clinical Impressions(s) / ED Diagnoses   Final diagnoses:  Finger infection    ED Discharge Orders    None       Cherre Robins, PA-C 09/20/18 2030    Gareth Morgan, MD 09/23/18 2348

## 2018-09-20 NOTE — ED Triage Notes (Signed)
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 Limited ROM d/t swelling and pain.

## 2018-09-20 NOTE — H&P (Addendum)
[facility] Medicine Teaching [facility] Admission History and Physical Service Pager: [phone]  Patient name: Megan Simmons Medical record number: [id number] Date of birth: February 26, 1995 Age: 23 y.o. Gender: female  Primary Care Provider: Patient, No Pcp Per Consultants: Hand Surgery, Dr. Cassity Christian PeaGramig Code Status: Full Preferred Emergency Contact: Neita GoodnightSantita Magar, mother, [phone]  Chief Complaint: right index finger pain and swelling  Assessment and Plan: Megan Simmons is a 23 y.o. previously healthy female presenting with worsening right index finger pain.  Past medical history significant for prediabetes.  Right Index finger pain  .Nieshia presenting with 2 days of right finger pain, erythema, and swelling.  Right index finger is erythematous and edematous however there is no warmth or fluctuance.  ROM somewhat limited due to pain and swelling, but patient has 5/5 strength in intrinsic muscles of the finger.  Neurovascularly intact. Hand surgeon, Dr. Mitsue Peery PeaGramig, saw patient and suspects superficial abscess and will take patient to the OR tomorrow.  Blood cultures pending, and Dr. Britnee Mcdevitt PeaGramig recommended IV vancomycin for coverage of skin flora. Flexor tenosynovitis cannot be ruled out as patient has finger flexion at rest, tenderness to palpation of the flexor tendons, circumferential swelling of the finger, and pain with passive extension on exam and WBC 14.7.  On x-ray there was no gaseous pattern concerning for necrotizing infection. - admit to med-surg, attending Dr. Leveda AnnaHensel - Peripheral blood cultures pending -Toradol every 6 hours as needed for severe pain  -Tylenol every 6 hours as needed for mild-moderate pain   - IV Vancomycin, pharmacy dosing -N.p.o. at midnight -Due to patient's young age and short duration of hospitalization, will encourage frequent ambulation rather than providing VTE prophylaxis  Prediabetes  Hemoglobin A1c 5.5 in October 2016, unsure if this has been  followed.  Glucose on BMP is normal. -Recheck A1C   Hidradenitis suppurativa Patient reports a history of axillary and inguinal lesions and has been treated with topical clindamycin and chlorhexidine.  Patient denies current lesions -Outpatient follow-up  FEN/GI: Regular diet however NPO after midnight  Prophylaxis: None  Disposition: Likely home   History of Present Illness:  Megan Simmons is a 23 y.o. 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  Finger became progressively erythematous and edematous throughout yesterday.  Endorses some decreased sensation and tingling in right index finger. Denies fever, weakness, nausea or vomiting, forearm or shoulder pain.  No recent sick contacts or known bug bites. No known family history of RA.  She tried no treatments at home.  Pain rated an 8 out of 10 and is described as throbbing. Patient reported nicking her finger with hair shears 3 days ago while cutting a wig.  Also of note, a week ago she picking at a callused wartlike growth on the same finger.  Warty growth has been present for the past year.  Denies recent hand trauma.  In the ED, patient found to have tenderness and erythema to the right index finger and pain with extension.  CBC significant for leukocytosis with left shift, WBC 14.7 and Neutro Abs 10.1.  BMP was unremarkable. Right  index finger x-ray indicated soft tissue swelling along the volar aspect of the DIP joint. Hand surgery consulted for concern for flexor tendosynovitis. Hand surgeon, Dr. Kenniya Westrich PeaGramig, will take patient to the OR for I&D of possible superficial abscess tomorrow.  Patient will be admitted for pain control and IV antibiotics for suspected right  finger abscess.  Vancomycin started in the ED. Patient will be NPO after midnight.   Review Of Systems: Per HPI with the following additions:   Review of Systems  Constitutional: Negative for  fever.  HENT: Negative for congestion and sore throat.   Respiratory: Negative for cough and shortness of breath.   Cardiovascular: Negative for chest pain and leg swelling.  Gastrointestinal: Negative for abdominal pain, nausea and vomiting.  Genitourinary: Negative for dysuria.  Musculoskeletal: Positive for joint pain (right index finger ). Negative for back pain and neck pain.  Skin:       Wound right index finger  Neurological: Negative for dizziness, focal weakness, weakness and headaches.  All other systems reviewed and are negative.   Patient Active Problem List   Diagnosis Date Noted  . Obesity 06/23/2015  . Hx: UTI (urinary tract infection) 06/23/2015  . Surveillance of implantable subdermal contraceptive 08/19/2014  . Vitamin D deficiency 03/26/2014  . Depressed mood 03/26/2014  . Hidradenitis suppurativa 10/07/2013  . Acne 12/16/2012    Past Medical History: Past Medical History:  Diagnosis Date  . Adjustment disorder with depressed mood 02/13/11   referred for counseling  . Constipation, chronic   . Depression   . Hidradenitis suppurativa    Mom not totally sure but described problem consistent with this diagnosis.  . Irregular menses   . Pelvic inflammatory disease (PID) 10/07/2013    Past Surgical History: Past Surgical History:  Procedure Laterality Date  . INCISION AND DRAINAGE ABSCESS Right 05/28/2013   Procedure: INCISION AND DRAINAGE SACRAL ABSCESS;  Surgeon: [name]. [name], MD;  Location: [facility];  Service: Pediatrics;  Laterality: Right;    Social History: Social History   Tobacco Use  . Smoking status: Light Tobacco Smoker  . Smokeless tobacco: Never Used  Substance Use Topics  . Alcohol use: Yes    Alcohol/week: 0.0 standard drinks    Comment: OCC  . Drug use: Yes    Types: Marijuana   Additional social history: Works at [employer] as a Engineer, building services.  Please also refer to relevant sections of EMR.  Family  History: Family History  Problem Relation Age of Onset  . Asthma Mother   . Hypertension Mother   . ADD / ADHD Mother   . Eczema Mother   . Diabetes Other   . Hypertension Other   Biological father's history is unknown.   Allergies and Medications: No Known Allergies No current facility-administered medications on file prior to encounter.    Current Outpatient Medications on File Prior to Encounter  Medication Sig Dispense Refill  . chlorhexidine (HIBICLENS) 4 % external liquid Apply topically daily as needed. Reported on 06/23/2015 473 mL 3  . clindamycin (CLEOCIN T) 1 % external solution Apply topically 2 (two) times daily. 60 mL 5  . DIFFERIN 0.1 % cream APPLY A THIN LAYER TO THE FACE NIGHTLY.  3  . doxycycline (VIBRAMYCIN) 100 MG capsule Take 100 mg by mouth 2 (two) times daily. Reported on 06/23/2015  5  . etonogestrel (NEXPLANON) 68 MG IMPL implant 1 each by Subdermal route once.      Objective: BP 135/79 (BP Location: Right Arm)   Pulse 69   Temp 98.5 F (36.9 C) (Oral)   Resp 17   LMP 09/10/2018   SpO2 100%   Exam: General: Anxious and tearful, non-ill appearing female, well-nourished well-developed Eyes: Pupils reactive and extraocular movements intact.  Sclera white Neck: Normal range of motion Cardiovascular: Regular rate  and rhythm no murmurs rubs or gallops Respiratory: Clear to auscultation bilaterally, no wheeze as well as rhonchi MSK: Right index finger erythematous and edematous from MCP to DIP joints, wart-like growth at DIP joint, no warmth, discharge or fluctuance, decreased range of motion in index finger, neurovascularly intact, flexion at rest, pain with passive extension, superficial skin tear on the lateral aspect of index finger, remaining digits unremarkable, sensation intact, normal hand strength, no forearm tenderness  Derm: See MSK exam above Neuro: Alert and oriented, normal speech, no abnormal coordination Psych: Anxious, normal thought  content  Labs and Imaging: CBC BMET  Recent Labs  Lab 09/20/18 1431  WBC 14.7*  HGB 13.0  HCT 40.9  PLT 329   Recent Labs  Lab 09/20/18 1431  NA 140  K 3.7  CL 107  CO2 28  BUN 8  CREATININE 0.73  GLUCOSE 99  CALCIUM 9.1       Lyndee Hensen, MD 09/20/2018, 4:03 PM PGY-1, [facility] pager: [phone], text pages welcome

## 2018-09-20 NOTE — Progress Notes (Addendum)
Pharmacy Antibiotic Note  Megan Simmons is a 23 y.o. female admitted on 09/20/2018 with R finger pain and erythema, possible abscess on finger. Planning to do surgical exploration of finger. Decided to not use vancomycin AUC dosing as the calculator generated a dose higher than I was comfortable starting in this patient. SCr wnl 0.7.   Plan: -Give an additional vancomycin 1 g now to complete 2 g load then begin vancomycin 1500 mg IV q12h -Monitor trough as needed -Watch SCr, cultures    Temp (24hrs), Avg:98.5 F (36.9 C), Min:98.5 F (36.9 C), Max:98.5 F (36.9 C)  Recent Labs  Lab 09/20/18 1431  WBC 14.7*  CREATININE 0.73    CrCl cannot be calculated (Unknown ideal weight.).    No Known Allergies  Harvel Quale 09/20/2018 5:29 PM

## 2018-09-20 NOTE — Anesthesia Procedure Notes (Signed)
Procedure Name: Intubation Date/Time: 09/20/2018 7:33 PM Performed by: Valetta Fuller, CRNA Pre-anesthesia Checklist: Patient identified, Emergency Drugs available, Suction available and Patient being monitored Patient Re-evaluated:Patient Re-evaluated prior to induction Oxygen Delivery Method: Circle system utilized Preoxygenation: Pre-oxygenation with 100% oxygen Induction Type: IV induction and Rapid sequence Laryngoscope Size: Miller and 2 Grade View: Grade I Tube type: Oral Tube size: 7.5 mm Number of attempts: 1 Airway Equipment and Method: Stylet Placement Confirmation: ETT inserted through vocal cords under direct vision,  positive ETCO2 and breath sounds checked- equal and bilateral Secured at: 23 cm Tube secured with: Tape Dental Injury: Teeth and Oropharynx as per pre-operative assessment

## 2018-09-20 NOTE — Progress Notes (Signed)
Pt left for the operating room accompanied by staff.

## 2018-09-20 NOTE — H&P (Signed)
Megan Simmons is an 23 y.o. female.   Chief Complaint: Patient presents with a right index finger infection somewhat indolent in nature.   HPI: Patient has been seen and evaluated.  At present juncture I been consulted for surgical management.  She has erythema redness and what appears to be localized abscess to the finger.  We will plan for exploration of the tendon sheath and irrigation and debridement.  I discussed with patient all issues as well as risk and benefits of surgery  Past Medical History:  Diagnosis Date  . Adjustment disorder with depressed mood 02/13/11   referred for counseling  . Constipation, chronic   . Depression   . Hidradenitis suppurativa    Mom not totally sure but described problem consistent with this diagnosis.  . Irregular menses   . Pelvic inflammatory disease (PID) 10/07/2013    Past Surgical History:  Procedure Laterality Date  . INCISION AND DRAINAGE ABSCESS Right 05/28/2013   Procedure: INCISION AND DRAINAGE SACRAL ABSCESS;  Surgeon: [name]. [name], MD;  Location: [facility];  Service: Pediatrics;  Laterality: Right;    Family History  Problem Relation Age of Onset  . Asthma Mother   . Hypertension Mother   . ADD / ADHD Mother   . Eczema Mother   . Diabetes Other   . Hypertension Other    Social History:  reports that she has been smoking. She has never used smokeless tobacco. She reports current alcohol use. She reports current drug use. Drug: Marijuana.  Allergies: No Known Allergies  (Not in a hospital admission)   Results for orders placed or performed during the hospital encounter of 09/20/18 (from the past 48 hour(s))  CBC with Differential     Status: Abnormal   Collection Time: 09/20/18  2:31 PM  Result Value Ref Range   WBC 14.7 (H) 4.0 - 10.5 K/uL   RBC 4.57 3.87 - 5.11 MIL/uL   Hemoglobin 13.0 12.0 - 15.0 g/dL   HCT 62.9 52.8 - 41.3 %   MCV 89.5 80.0 - 100.0 fL   MCH 28.4 26.0 - 34.0 pg   MCHC 31.8  30.0 - 36.0 g/dL   RDW 24.4 01.0 - 27.2 %   Platelets 329 150 - 400 K/uL   nRBC 0.0 0.0 - 0.2 %   Neutrophils Relative % 69 %   Neutro Abs 10.1 (H) 1.7 - 7.7 K/uL   Lymphocytes Relative 23 %   Lymphs Abs 3.4 0.7 - 4.0 K/uL   Monocytes Relative 7 %   Monocytes Absolute 1.0 0.1 - 1.0 K/uL   Eosinophils Relative 1 %   Eosinophils Absolute 0.1 0.0 - 0.5 K/uL   Basophils Relative 0 %   Basophils Absolute 0.0 0.0 - 0.1 K/uL   Immature Granulocytes 0 %   Abs Immature Granulocytes 0.06 0.00 - 0.07 K/uL    Comment: Performed at [facility] [place] [facility], [address], [place], [place] [address]  Basic metabolic panel     Status: None   Collection Time: 09/20/18  2:31 PM  Result Value Ref Range   Sodium 140 135 - 145 mmol/L   Potassium 3.7 3.5 - 5.1 mmol/L   Chloride 107 98 - 111 mmol/L   CO2 28 22 - 32 mmol/L   Glucose, Bld 99 70 - 99 mg/dL   BUN 8 6 - 20 mg/dL   Creatinine, Ser 4.03 0.44 - 1.00 mg/dL   Calcium 9.1 8.9 - 47.4 mg/dL   GFR calc  non Af Amer >60 >60 mL/min   GFR calc Af Amer >60 >60 mL/min   Anion gap 5 5 - 15    Comment: Performed at [facility] [address], [place], [place] [address]  I-Stat beta hCG blood, ED     Status: None   Collection Time: 09/20/18  2:37 PM  Result Value Ref Range   I-stat hCG, quantitative <5.0 <5 mIU/mL   Comment 3            Comment:   GEST. AGE      CONC.  (mIU/mL)   <=1 WEEK        5 - 50     2 WEEKS       50 - 500     3 WEEKS       100 - 10,000     4 WEEKS     1,000 - 30,000        FEMALE AND NON-PREGNANT FEMALE:     LESS THAN 5 mIU/mL    Dg Finger Index Right  Result Date: 09/20/2018 CLINICAL DATA:  Pt states R index finger begun to swell from tip of DIP to MCP joint area x yesterday. Patient reports R finger pain since earlier this year, however noticed new swelling and redness extending from distal finger to hand yesterday at work. Limited range of motion. EXAM: RIGHT INDEX FINGER 2+V COMPARISON:  None. FINDINGS: There is soft  tissue swelling of the index finger. Soft tissue irregularity is identified along the volar aspect of the distal interphalangeal joint. No radiopaque foreign body or soft tissue gas. No acute fracture or osseous erosions. IMPRESSION: Soft tissue swelling of the index finger. Electronically Signed   By: Nolon Nations M.D.   On: 09/20/2018 14:07    Review of Systems  Respiratory: Negative.   Cardiovascular: Negative.     Blood pressure 135/79, pulse 69, temperature 98.5 F (36.9 C), temperature source Oral, resp. rate 17, last menstrual period 09/10/2018, SpO2 100 %. Physical Exam  Abscess right index finger with cellulitis lymphangitis.  We will plan for irrigation debridement and repair is necessary.  The patient is alert and oriented in no acute distress. The patient complains of pain in the affected upper extremity.  The patient is noted to have a normal HEENT exam. Lung fields show equal chest expansion and no shortness of breath. Abdomen exam is nontender without distention. Lower extremity examination does not show any fracture dislocation or blood clot symptoms. Pelvis is stable and the neck and back are stable and nontender. Assessment/Plan We will plan for irrigation and debridement of the right index finger with repair reconstruction as necessary  We are planning surgery for your upper extremity. The risk and benefits of surgery to include risk of bleeding, infection, anesthesia,  damage to normal structures and failure of the surgery to accomplish its intended goals of relieving symptoms and restoring function have been discussed in detail. With this in mind we plan to proceed. I have specifically discussed with the patient the pre-and postoperative regime and the dos and don'ts and risk and benefits in great detail. Risk and benefits of surgery also include risk of dystrophy(CRPS), chronic nerve pain, failure of the healing process to go onto completion and other inherent risks  of surgery The relavent the pathophysiology of the disease/injury process, as well as the alternatives for treatment and postoperative course of action has been discussed in great detail with the patient who desires to proceed.  We will do everything in our power to help you (the patient) restore function to the upper extremity. It is a pleasure to see this patient today.   Oletta CohnWilliam M Kade Rickels III, MD 09/20/2018, 5:31 PM

## 2018-09-20 NOTE — ED Notes (Signed)
ED TO INPATIENT HANDOFF REPORT  ED Nurse Name and Phone #: Avid Guillette [phone]  S Name/Age/Gender Megan Simmons 23 y.o. female Room/Bed: 003C/003C  Code Status   Code Status: Full Code  Home/SNF/Other Home Patient oriented to: self, place, time and situation Is this baseline? 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 Limited ROM d/t swelling and pain.    Allergies No Known Allergies  Level of Care/Admitting Diagnosis ED Disposition    ED Disposition Condition Comment   Admit  [facility] [[id number][facility]  Level of Care: Med-Surg [16]  Covid Evaluation: Asymptomatic Screening Protocol (No Symptoms)  Diagnosis: Abscess of right index finger [[id number]][[id number]]  Admitting Physician: Lennox SoldersWINFREY, AMANDA C [[id number]]  Attending Physician: Doralee AlbinoHENSEL, WILLIAM A [[id number]]  PT Class (Do Not Modify): Observation [104]  PT Acc Code (Do Not Modify): Observation [10022]       B Medical/Surgery History Past Medical History:  Diagnosis Date  . Adjustment disorder with depressed mood 02/13/11   referred for counseling  . Constipation, chronic   . Depression   . Hidradenitis suppurativa    Mom not totally sure but described problem consistent with this diagnosis.  . Irregular menses   . Pelvic inflammatory disease (PID) 10/07/2013   Past Surgical History:  Procedure Laterality Date  . INCISION AND DRAINAGE ABSCESS Right 05/28/2013   Procedure: INCISION AND DRAINAGE SACRAL ABSCESS;  Surgeon: Judie PetitM. Leonia CoronaShuaib Farooqui, MD;  Location: [facility];  Service: Pediatrics;  Laterality: Right;     A IV  Location/Drains/Wounds Patient Lines/Drains/Airways Status   Active Line/Drains/Airways    Name:   Placement date:   Placement time:   Site:   Days:   Peripheral IV 09/20/18 Right Antecubital   09/20/18    1625    Antecubital   less than 1   Incision (Closed) 05/28/13 Coccyx Right   05/28/13    1241     1941          Intake/Output Last 24 hours No intake or output data in the 24 hours ending 09/20/18 1714  Labs/Imaging Results for orders placed or performed during the hospital encounter of 09/20/18 (from the past 48 hour(s))  CBC with Differential     Status: Abnormal   Collection Time: 09/20/18  2:31 PM  Result Value Ref Range   WBC 14.7 (H) 4.0 - 10.5 K/uL   RBC 4.57 3.87 - 5.11 MIL/uL   Hemoglobin 13.0 12.0 - 15.0 g/dL   HCT 62.140.9 30.836.0 - 65.746.0 %   MCV 89.5 80.0 - 100.0 fL   MCH 28.4 26.0 - 34.0 pg   MCHC 31.8 30.0 - 36.0 g/dL   RDW 84.612.9 96.211.5 - 95.215.5 %   Platelets 329 150 - 400 K/uL   nRBC 0.0 0.0 - 0.2 %   Neutrophils Relative % 69 %   Neutro Abs 10.1 (H) 1.7 - 7.7 K/uL   Lymphocytes Relative 23 %   Lymphs Abs 3.4 0.7 - 4.0 K/uL   Monocytes Relative 7 %   Monocytes Absolute 1.0 0.1 - 1.0 K/uL   Eosinophils Relative 1 %   Eosinophils Absolute 0.1 0.0 -  0.5 K/uL   Basophils Relative 0 %   Basophils Absolute 0.0 0.0 - 0.1 K/uL   Immature Granulocytes 0 %   Abs Immature Granulocytes 0.06 0.00 - 0.07 K/uL    Comment: Performed at [facility], [place] [address] [place], [place] [address]  Basic metabolic panel     Status: None   Collection Time: 09/20/18  2:31 PM  Result Value Ref Range   Sodium 140 135 - 145 mmol/L   Potassium 3.7 3.5 - 5.1 mmol/L   Chloride 107 98 - 111 mmol/L   CO2 28 22 - 32 mmol/L   Glucose, Bld 99 70 - 99 mg/dL   BUN 8 6 - 20 mg/dL   Creatinine, Ser 0.73 0.44 - 1.00 mg/dL   Calcium 9.1 8.9 - 10.3 mg/dL   GFR calc non Af Amer >60 >60 mL/min   GFR calc Af Amer >60 >60 mL/min   Anion gap 5 5 - 15    Comment: Performed at [facility], [place] [address] [place], [place] [address]  I-Stat beta hCG blood, ED     Status: None   Collection Time: 09/20/18  2:37 PM  Result Value Ref Range   I-stat hCG, quantitative <5.0 <5 mIU/mL   Comment 3            Comment:   GEST. AGE      CONC.  (mIU/mL)   <=1 WEEK        5 - 50     2 WEEKS       50 - 500     3 WEEKS       100 - 10,000     4 WEEKS     1,000 - 30,000        FEMALE AND NON-PREGNANT FEMALE:     LESS THAN 5 mIU/mL    Dg Finger Index Right  Result Date: 09/20/2018 CLINICAL DATA:  Pt states R index finger begun to swell from tip of DIP to MCP joint area x yesterday. Patient reports R finger pain since earlier this year, however noticed new swelling and redness extending from distal finger to hand yesterday at work. Limited range of motion. EXAM: RIGHT INDEX FINGER 2+V COMPARISON:  None. FINDINGS: There is soft tissue swelling of the index finger. Soft tissue irregularity is identified along the volar aspect of the distal interphalangeal joint. No radiopaque foreign body or soft tissue gas. No acute fracture or osseous erosions. IMPRESSION: Soft tissue swelling of the index finger. Electronically Signed   By: Nolon Nations M.D.   On: 09/20/2018 14:07    Pending Labs Unresulted Labs (From admission, onward)    Start     Ordered   09/21/18 0500  HIV antibody (Routine Testing)  Tomorrow morning,   R     09/20/18 1704   09/21/18 0500  Hemoglobin A1c  Tomorrow morning,   R     09/20/18 1704   09/21/18 0500  CBC  Tomorrow morning,   R     09/20/18 1704   09/21/18 7342  Basic metabolic panel  Tomorrow morning,   R     09/20/18 1704   09/20/18 1541  Culture, blood (routine x 2)  BLOOD CULTURE X 2,   STAT     09/20/18 1541   09/20/18 1533  SARS Coronavirus 2 [facility] order, Performed in [facility] hospital lab) Nasopharyngeal Nasopharyngeal Swab  (Symptomatic/High Risk of Exposure/Tier 1 Patients Labs with Precautions)  Once,   STAT    Question Answer Comment  Is this test for  diagnosis or screening Screening   Symptomatic for COVID-19 as defined by CDC No   Hospitalized for COVID-19 No   Admitted to ICU for COVID-19 No   Previously tested for COVID-19 No   Resident in a congregate (group) care setting No   Employed in healthcare setting No   Pregnant No      09/20/18 1533          Vitals/Pain Today's Vitals   09/20/18 1303 09/20/18 1310 09/20/18 1449  BP: 135/79    Pulse: 69    Resp: 17    Temp: 98.5 F (36.9 C)    TempSrc: Oral    SpO2: 100%    PainSc:  10-Worst pain ever 9     Isolation Precautions Airborne and Contact precautions  Medications Medications  vancomycin (VANCOCIN) IVPB 1000 mg/200 mL premix (1,000 mg Intravenous New Bag/Given 09/20/18 1630)  ketorolac (TORADOL) 30 MG/ML injection 30 mg (has no administration in time range)  acetaminophen (TYLENOL) tablet 650 mg (has no administration in time range)  acetaminophen (TYLENOL) tablet 650 mg (650 mg Oral Given 09/20/18 1449)    Mobility walks Low fall risk   Focused Assessments NA   R Recommendations: See Admitting Provider Note  Report given to:   Additional Notes:

## 2018-09-20 NOTE — Op Note (Signed)
Operative note: 09/20/2018  Roseanne Kaufman MD.  Preoperative diagnosis abscess right index finger complicated in nature  Postop diagnosis the same  Procedure: Irrigation debridement deep complicated abscess right index finger  Roseanne Kaufman MD  Anesthesia General.  Estimated blood loss minimal.  Complications none.  Cultures aerobic and anaerobic taken.  Operative indications patient has an indolent infection about the right index finger.  We have recommended irrigation and debridement.  Operative procedure-patient was taken to the operative theater and underwent a smooth induction of general anesthesia she was prepped and draped with Hibiclens pre-scrub followed by attainment surgical Betadine scrub and paint.  Once this was complete outlined marks were made and I removed a large callus over the distal interphalangeal joint.  A sinus tract was noted and went down beneath the subcu tissue.  This was evacuated and was an obvious abscess.  I made a counterincision more proximally and then performed a exploration of the flexor sheath and adjacent structures.  The patient had no evidence of infection in the sheath.  I placed a Penrose drain through the incisions and irrigated copiously.  This was an irrigation debridement of a deep abscess complicated in nature.  She had good refill I did not inflate the tourniquet.  Elevation range of motion to the finger and hopefully quick progression towards outpatient observation and wet-to-dry dressing changes.  She was woken from anesthesia in stable condition.  There were no complications.  I will see her daily as an inpatient.  Adolphe Fortunato MD

## 2018-09-20 NOTE — Transfer of Care (Signed)
Immediate Anesthesia Transfer of Care Note  Patient: Megan Simmons  Procedure(s) Performed: IRRIGATION AND DEBRIDEMENT INDEX FINGER (Right Finger)  Patient Location: PACU  Anesthesia Type:General  Level of Consciousness: awake, alert  and oriented  Airway & Oxygen Therapy: Patient connected to nasal cannula oxygen  Post-op Assessment: Report given to RN and Post -op Vital signs reviewed and stable  Post vital signs: Reviewed and stable  Last Vitals:  Vitals Value Taken Time  BP 132/69 09/20/18 2010  Temp    Pulse 81 09/20/18 2012  Resp 16 09/20/18 2012  SpO2 90 % 09/20/18 2012  Vitals shown include unvalidated device data.  Last Pain:  Vitals:   09/20/18 1820  TempSrc:   PainSc: 9          Complications: No apparent anesthesia complications

## 2018-09-21 ENCOUNTER — Encounter (HOSPITAL_COMMUNITY): Payer: Self-pay | Admitting: Orthopedic Surgery

## 2018-09-21 LAB — CBC
HCT: 40.6 % (ref 36.0–46.0)
Hemoglobin: 13.4 g/dL (ref 12.0–15.0)
MCH: 29.1 pg (ref 26.0–34.0)
MCHC: 33 g/dL (ref 30.0–36.0)
MCV: 88.1 fL (ref 80.0–100.0)
Platelets: 308 10*3/uL (ref 150–400)
RBC: 4.61 MIL/uL (ref 3.87–5.11)
RDW: 12.5 % (ref 11.5–15.5)
WBC: 15.2 10*3/uL — ABNORMAL HIGH (ref 4.0–10.5)
nRBC: 0 % (ref 0.0–0.2)

## 2018-09-21 LAB — HEMOGLOBIN A1C
Hgb A1c MFr Bld: 5.5 % (ref 4.8–5.6)
Mean Plasma Glucose: 111.15 mg/dL

## 2018-09-21 LAB — BASIC METABOLIC PANEL
Anion gap: 8 (ref 5–15)
BUN: 8 mg/dL (ref 6–20)
CO2: 24 mmol/L (ref 22–32)
Calcium: 9.2 mg/dL (ref 8.9–10.3)
Chloride: 106 mmol/L (ref 98–111)
Creatinine, Ser: 0.66 mg/dL (ref 0.44–1.00)
GFR calc Af Amer: >60 mL/min (ref >60)
GFR calc non Af Amer: >60 mL/min (ref >60)
Glucose, Bld: 124 mg/dL — ABNORMAL HIGH (ref 70–99)
Potassium: 4.4 mmol/L (ref 3.5–5.1)
Sodium: 138 mmol/L (ref 135–145)

## 2018-09-21 MED ORDER — MUPIROCIN CALCIUM 2 % EX CREA
TOPICAL_CREAM | Freq: Two times a day (BID) | CUTANEOUS | Status: DC
  Administered 2018-09-22: 09:00:00 via TOPICAL
  Filled 2018-09-21: qty 15

## 2018-09-21 MED ORDER — DOXYCYCLINE HYCLATE 100 MG PO TABS
100.0000 mg | ORAL_TABLET | Freq: Two times a day (BID) | ORAL | Status: DC
  Administered 2018-09-21 – 2018-09-22 (×3): 100 mg via ORAL
  Filled 2018-09-21 (×3): qty 1

## 2018-09-21 NOTE — Anesthesia Postprocedure Evaluation (Signed)
Anesthesia Post Note  Patient: Megan Simmons  Procedure(s) Performed: IRRIGATION AND DEBRIDEMENT INDEX FINGER (Right Finger)     Patient location during evaluation: PACU Anesthesia Type: General Level of consciousness: awake and alert Pain management: pain level controlled Vital Signs Assessment: post-procedure vital signs reviewed and stable Respiratory status: spontaneous breathing, nonlabored ventilation, respiratory function stable and patient connected to nasal cannula oxygen Cardiovascular status: blood pressure returned to baseline and stable Postop Assessment: no apparent nausea or vomiting Anesthetic complications: no    Last Vitals:  Vitals:   09/20/18 2111 09/20/18 2352  BP: 127/84 113/67  Pulse: 81 74  Resp: 18 18  Temp: 36.9 C 37 C  SpO2: 92% 99%    Last Pain:  Vitals:   09/21/18 0200  TempSrc:   PainSc: 8                  Ryan P Ellender

## 2018-09-21 NOTE — Progress Notes (Signed)
Patient ID: Megan Simmons, female   DOB: [birth year], 23 y.o.   MRN: [id number] Patient seen and evaluated at bedside.  I performed a comprehensive repeat debridement today.  The patient has an abscess about her right index finger.  We performed irrigation debridement of skin and subcutaneous tissue today at bedside.Patient  carefully had the bandage removed without difficulty. Following this we performed a  irrigation and debridement.  Procedure note: Patient underwent thorough prep and sterile field application. Following this with combination knife blade scissor tip and curette we performed irrigation and debridement of skin and subcutaneous tissue. Deep tissue was also irrigated and removed. Patient tolerated this well. Copious  amounts of saline were placed in the wound. There is approximately a liter of irrigant applied to the wound. Once again this was an excisional debridement of skin subcutaneous tissue as well as associated deep tissue about the wound.  Following this the patient was packed with wet-to-dry gauze. We'll continue bedside irrigation and debridement and dressing changes in an aggressive fashion.  Patient tolerated the irrigation and debridement procedure without difficulty.  The patient understands the necessity of strict wound care antibiotics and other measures. Oftentimes wounds will take days to declare themselves. We'll do everything in our power to try and ensure uneventful healing.   I discussed with the patient she will likely be ready for discharge tomorrow.  I will change her dressing tomorrow and perform a another repeat debridement.  I wonder make sure we wait the wound and get excellent conditions prior to DC home.  We will await the cultures and continue empiric antibiotics.  I have encouraged her to elevate move massage the finger.  Once again hopefully discharge home tomorrow after repeat I&D at bedside by myself given the holiday schedule  Tresa Jolley  MD

## 2018-09-21 NOTE — Progress Notes (Signed)
Family Medicine Teaching Service Daily Progress Note Intern Pager: [phone]  Patient name: Megan Simmons Medical record number: [id number] Date of birth: 14-May-1995 Age: 23 y.o. Gender: female  Primary Care Provider: Patient, No Pcp Per Consultants: Ortho hand surgery Code Status: Full  Pt Overview and Major Events to Date:  9/5-admission for incision and drainage of right second finger abscess  Assessment and Plan: Megan Simmons is a 23 y.o. previously healthy female presenting with worsening right index finger pain.  Past medical history significant for prediabetes.  Abscess of right second finger Status post incision and drainage in the OR on 9/5.  According to the operative note, the surgery went smoothly without complication.  An abscess was noted along the flexor surface of the distal phalanx.  Penrose drain was left at completion of surgery.   Vital signs remain unremarkable through the night.  Blood culture, wound culture pending.  Possible discharge home today if cleared by surgery. -Follow-up wound culture/blood culture -DC vancomycin -Transition to doxy p.o. for total of a 7-day course (will modify if hand surgery has specific recommendations) -Oxycodone 10 mg every 4 as needed -Ketorolac every 6 hours as needed -Tylenol PRN  Prediabetes -Awaiting collection of A1c  Hydradenitis suppurativa -PCP follow up outpatient  FEN/GI: General diet PPx: No DVT prophylaxis, early ambulation anticipated  Disposition: Possible discharge home today pending orthopedics evaluation.  Subjective:  Successful surgery last night.  No acute events overnight.  This morning, she notes some discomfort of her right index finger which improves with her current pain medication.  She was able to eat a meal last night without issues with nausea/vomiting.  She has been up and out of bed to urinate.  She denies any bowel movement or passing of gas since the surgery.  Objective: Temp:   [97.2 F (36.2 C)-99.3 F (37.4 C)] 98.5 F (36.9 C) (09/06 0316) Pulse Rate:  [63-90] 66 (09/06 0316) Resp:  [15-21] 20 (09/06 0316) BP: (113-135)/(66-88) 120/66 (09/06 0316) SpO2:  [89 %-100 %] 96 % (09/06 0316) Weight:  [112.5 kg] 112.5 kg (09/05 2100) Physical Exam: General: Alert and cooperative and appears to be in no acute distress HEENT: Neck non-tender without lymphadenopathy, masses or thyromegaly Cardio: Normal S1 and S2, no S3 or S4. Rhythm is regular.  1/6 systolic murmur loudest at left sternal border.   Pulm: Clear to auscultation bilaterally, no crackles, wheezing, or diminished breath sounds. Normal respiratory effort Abdomen: Bowel sounds normal. Abdomen soft and non-tender.  Extremities: No peripheral edema. Warm/ well perfused.  Strong radial pulse. Neuro: Cranial nerves grossly intact   Laboratory: Recent Labs  Lab 09/20/18 1431  WBC 14.7*  HGB 13.0  HCT 40.9  PLT 329   Recent Labs  Lab 09/20/18 1431  NA 140  K 3.7  CL 107  CO2 28  BUN 8  CREATININE 0.73  CALCIUM 9.1  GLUCOSE 99    Imaging/Diagnostic Tests: Dg Finger Index Right  Result Date: 09/20/2018 CLINICAL DATA:  Pt states R index finger begun to swell from tip of DIP to MCP joint area x yesterday. Patient reports R finger pain since earlier this year, however noticed new swelling and redness extending from distal finger to hand yesterday at work. Limited range of motion. EXAM: RIGHT INDEX FINGER 2+V COMPARISON:  None. FINDINGS: There is soft tissue swelling of the index finger. Soft tissue irregularity is identified along the volar aspect of the distal interphalangeal joint. No radiopaque foreign body or soft tissue gas. No  acute fracture or osseous erosions. IMPRESSION: Soft tissue swelling of the index finger. Electronically Signed   By: Norva PavlovElizabeth  Brown M.D.   On: 09/20/2018 14:07   Mirian MoFrank, Cassi Jenne, MD 09/21/2018, 5:48 AM PGY-2, [facility] FPTS Intern pager: [phone], text  pages welcome

## 2018-09-21 NOTE — Plan of Care (Signed)
  Problem: Pain Managment: Goal: General experience of comfort will improve Outcome: Progressing   Problem: Safety: Goal: Ability to remain free from injury will improve Outcome: Progressing   Problem: Skin Integrity: Goal: Risk for impaired skin integrity will decrease Outcome: Progressing   

## 2018-09-21 NOTE — Discharge Summary (Signed)
[facility] Discharge Summary  Patient name: Megan Simmons Medical record number: [id number] Date of birth: Dec 26, 1995 Age: 23 y.o. Gender: female Date of Admission: 09/20/2018  Date of Discharge: 09/22/2018 Admitting Physician: Zenia Resides, MD  Primary Care Provider: Patient, No Pcp Per Consultants: Ortho hand surgery  Indication for Hospitalization: Finger pain 2/2 to abscess  Discharge Diagnoses/Problem List:  Abscess of right second finger Prediabetes Hydradenitis suppurativa   Disposition: Home  Discharge Condition: Stable  Discharge Exam:   General: Appears well, no acute distress. Age appropriate. Cardiac: RRR, normal heart sounds, no murmurs Respiratory: CTAB, normal effort Abdomen: soft, nontender, nondistended Extremities: No edema or cyanosis. Right index finger with dressing on it Skin: Warm and dry, no rashes noted Neuro: alert and oriented, no focal deficits Psych: normal affect   Brief Hospital Course:  Megan Simmons is a 23 y.o. previously healthy female who presented with worsening right index finger pain.  Patient's vital signs were unremarkable on admission and a blood culture and wound culture were obtained.  Patient was admitted in order to have incision and drainage of her abscess on her right second finger and was started on vancomycin.  Patient had I&D in the OR on 9/5 with no complications. Abscess was noted along the flexor surface of the distal phalanx, Penrose drain was left at completion of surgery.  Patient was subsequently switched from IV vancomycin (9/5-9/6) to oral doxycycline on 9/6.  Patient was followed along by orthopedics who also performed an irrigation debridement of skin and subcutaneous tissue at bedside on 9/6.  Patient underwent second bedside irrigation debridement of skin and subcutaneous tissue on 9/7 which patient tolerated well and was subsequently discharged on 09/22/2018 with oral  doxycycline to complete a 14 day course with an end date of 9/20. Wound culture grew RARE GRAM POSITIVE COCCI. Patient remained afebrile throughout stay and blood culture with no growth at 2 days.    Issues for Follow Up:  1. Ensure that she has completed her 14 day course of doxy (end date 9/20) 2. Follow up with hand surgery.  Significant Procedures:  9/5 - incision and drainage of right second finger abscess. No complications.  Significant Labs and Imaging:  Recent Labs  Lab 09/20/18 1431 09/21/18 0743  WBC 14.7* 15.2*  HGB 13.0 13.4  HCT 40.9 40.6  PLT 329 308   Recent Labs  Lab 09/20/18 1431 09/21/18 0743  NA 140 138  K 3.7 4.4  CL 107 106  CO2 28 24  GLUCOSE 99 124*  BUN 8 8  CREATININE 0.73 0.66  CALCIUM 9.1 9.2      Results/Tests Pending at Time of Discharge: None  Discharge Medications:  Allergies as of 09/22/2018   No Known Allergies     Medication List    TAKE these medications   acetaminophen 325 MG tablet Commonly known as: TYLENOL Take 2 tablets (650 mg total) by mouth every 6 (six) hours as needed for mild pain or moderate pain.   BENADRYL EX Apply 1 application topically daily as needed (itching).   diphenhydrAMINE 25 MG tablet Commonly known as: BENADRYL Take 25 mg by mouth every 6 (six) hours as needed for allergies.   doxycycline 100 MG tablet Commonly known as: VIBRA-TABS Take 1 tablet (100 mg total) by mouth every 12 (twelve) hours for 11 days.   Nexplanon 68 MG Impl implant Generic drug: etonogestrel 68 mg by Subdermal route once. Implanted December 2019  Discharge Instructions: Please refer to Patient Instructions section of EMR for full details.  Patient was counseled important signs and symptoms that should prompt return to medical care, changes in medications, dietary instructions, activity restrictions, and follow up appointments.   Follow-Up Appointments:   Lavonda Jumboutry-Lott, Shailee Foots, DO 09/22/2018, 2:09 PM PGY-1, [facility]

## 2018-09-22 MED ORDER — ACETAMINOPHEN 325 MG PO TABS: 650.0000 mg | ORAL_TABLET | Freq: Four times a day (QID) | ORAL | Status: DC | PRN

## 2018-09-22 MED ORDER — DOXYCYCLINE HYCLATE 100 MG PO TABS: 100.0000 mg | ORAL_TABLET | Freq: Two times a day (BID) | ORAL | 0 refills | Status: AC

## 2018-09-22 NOTE — Progress Notes (Signed)
Patient ID: Megan Simmons, female   DOB: 02-Jul-1995, 23 y.o.   MRN: [id number] Patient is doing beautifully without signs of infection dystrophy or vascular compromise.  I once again performed an irrigation debridement of skin and subtendinous tissue at bedside there were no complicating features.  The patient and myself have discussed these issues plans and concerns.  I think she is very stable to move forward with outpatient treatment.  I would recommend doxycycline for 14 days 100 mg twice daily and dressing changes as I extensively discussed today.  Once again we performed a bedside I&D as described below.Patient  carefully had the bandage removed without difficulty. Following this we performed a  irrigation and debridement.  Procedure note: Patient underwent thorough prep and sterile field application. Following this with combination knife blade scissor tip and curette we performed irrigation and debridement of skin and subcutaneous tissue. Deep tissue was also irrigated and removed. Patient tolerated this well. Copious  amounts of saline were placed in the wound. There is approximately a liter of irrigant applied to the wound. Once again this was an excisional debridement of skin subcutaneous tissue as well as associated deep tissue about the wound.  Following this the patient was packed with wet-to-dry gauze. We'll continue bedside irrigation and debridement and dressing changes in an aggressive fashion.  Patient tolerated the irrigation and debridement procedure without difficulty.  The patient understands the necessity of strict wound care antibiotics and other measures. Oftentimes wounds will take days to declare themselves. We'll do everything in our power to try and ensure uneventful healing.   Should any problems occur she will notify us.  I will be happy to see her in the office in a week however I feel this will likely resolve without much difficulty.  Lucresha Dismuke MD

## 2018-09-22 NOTE — Discharge Instructions (Signed)
You were admitted for an abscess in your finger, which was cleaned out multiple times by hand surgery.  We did not find any bacteria in your bloodstream.  Please continue taking the antibiotic doxycycline twice daily for your finger through Friday, September 11.  Please let your hand surgeon know if you are having any worsening pain, swelling, or redness on your finger.  Thank you for allowing us to take care of you.  Fingertip Infection There are two main types of fingertip infections:  Long-term (chronic) or acute paronychia. This is an infection that happens around your nail. This type of infection can start in one nail or occur gradually over time and affect more than one nail. The fingernails that are infected may become thick and deformed. This condition can also happen suddenly (be acute).  Felon. This is a bacterial infection in the tip of your finger (pad). A felon infection can cause a painful collection of pus (an abscess) to form inside your fingertip. If the infection is not treated, the infection can spread as deep as the tendon or bone. What are the causes? Paronychia infection can be caused by:  Bacteria.  Funguses.  A mix of both bacteria and funguses. A felon infection is usually caused by the bacteria that are normally found on your skin. An infection can develop if the bacteria spread through your skin to the pad of tissue inside your fingertip. What increases the risk? You are more likely to develop a fingertip infection if:  You have diabetes.  You have a weak body's defense system (immune system).  You work with your hands.  Your hands are exposed to moisture, chemicals, or irritants for long periods of time.  You have poor circulation.  You bite, chew, or pick your fingernails. What are the signs or symptoms? Symptoms of paronychia infection may affect one or more fingernails and may include:  Pain, swelling, and redness around the nail.  Pus-filled pockets  at the base or side of the fingernail (cuticle).  Thick fingernails that separate from the nail bed.  Pus that drains from the nail bed. Symptoms of a felon usually affect just one fingertip pad and include:  Severe, throbbing pain.  Redness.  Swelling.  Warmth.  Tenderness when the affected fingertip is touched. How is this diagnosed? This condition is diagnosed based on:  Your medical history.  A physical exam.  Testing. If there is pus draining from the infection, it may be swabbed and sent to the lab for a culture.  An X-ray. This may be done to see if the infection has spread to the bone. How is this treated? Treatment for a fingertip infection may include:  Warm water or salt-water soaks several times per day.  Antibiotic medicine. This may be an ointment or pills.  Steroid ointment.  Antifungal pills.  Drainage of pus pockets. This is done by making an incision to open the fingertip to drain pus.  Wearing gloves to protect your nails. Follow these instructions at home: Medicines  Take or apply over-the-counter and prescription medicines only as told by your health care provider.  If you were prescribed an antibiotic medicine, take or apply it as told by your health care provider. Do not stop using the antibiotic even if you start to feel better. Wound care  Follow instructions from your health care provider about how to take care of your wound. Make sure you: ? Wash your hands with soap and water before and after you  change your bandage (dressing). If soap and water are not available, use hand sanitizer. ? Change your dressing as told by your health care provider. ? Leave stitches (sutures), skin glue, or adhesive strips in place. These skin closures may need to stay in place for 2 weeks or longer. If adhesive strip edges start to loosen and curl up, you may trim the loose edges. Do not remove adhesive strips completely unless your health care provider tells  you to do that.  Clean the infected area each day with warm water or salt water, or as told by your health care provider. ? Gently wash the infected area with mild soap and water. ? Rinse the infected area with water to remove all soap. ? Pat the infected area dry with a clean towel. Do not rub it. ? To make a salt-water mixture, completely dissolve -1 tsp (3-6 g) of salt in 1 cup (237 mL) of warm water.  Check the infected area every day for more signs of infection. Watch for: ? More redness, swelling, or pain. ? More fluid or blood. ? Warmth. ? A bad smell. Bathing  Keep the dressing dry until your health care provider says it can be removed.  Ask your health care provider if you may take baths, swim, shower, or use a hot tub. To help prevent spread of the infection, you may only be allowed to take sponge baths. This is rare.  Do not let your bandage get wet. Cover it with a watertight covering when you take a bath or shower. General instructions  Follow instructions from your health care provider about: ? How to take care of the infection. ? When and how you should change your bandage (dressing). ? When you should remove your dressing.  Raise (elevate) the infected area above the level of your heart while you are sitting or lying down or as told by your health care provider. This will help reduce inflammation.  Do not scratch or pick at the infected area.  Wear gloves as told by your health care provider.  Keep all follow-up visits as told by your health care provider. This is important. How is this prevented?  Wear gloves when you work with your hands.  Wash your hands often with antibacterial soap.  Avoid letting your hands stay wet or irritated for long periods of time.  Do not bite your fingernails.  Do not suck on your fingers.  Do not pull on your cuticles.  Use clean scissors or nail clippers to trim your nails. Do not cut your fingernails very  short. Contact a health care provider if:  Your pain medicine is not helping.  You have more redness, swelling, or pain at your fingertip.  You continue to have fluid, blood, or pus coming from your fingertip.  Your infection area feels warm to the touch.  You continue to notice a bad smell coming from your fingertip or your dressing. Get help right away if:  The area of redness is spreading, or you notice a red streak going away from your fingertip.  You have a fever. Summary  Paronychia is an infection that happens around your nail. Paronychia infection can be caused by bacteria, funguses, or a mix of both.  A felon infection is usually caused by the bacteria that are normally found on your skin. An infection can develop if the bacteria spread through your skin to the pad of tissue inside your fingertip.  Follow instructions from your health care  provider about how to take care of the infection.  Take or apply over-the-counter and prescription medicines only as told by your health care provider.  Contact a health care provider if you have more drainage, redness, swelling, or pain at your fingertip. This information is not intended to replace advice given to you by your health care provider. Make sure you discuss any questions you have with your health care provider. Document Released: 02/09/2004 Document Revised: 10/01/2017 Document Reviewed: 10/01/2017 Elsevier Patient Education  2020 [employer].

## 2018-09-22 NOTE — Progress Notes (Signed)
Pt was given discharge instructions and work [employer]ar[employer]. Did not have any questions IV had been removed and prescriptions escribed to pharmacy. Pt walked herself out of unit did not notify nurse or staff about when she was leaving

## 2018-09-24 LAB — HIV ANTIBODY (ROUTINE TESTING W REFLEX): HIV Screen 4th Generation wRfx: NONREACTIVE

## 2018-09-25 LAB — AEROBIC/ANAEROBIC CULTURE W GRAM STAIN (SURGICAL/DEEP WOUND): Gram Stain: NONE SEEN

## 2018-09-29 LAB — CULTURE, BLOOD (ROUTINE X 2)
Culture: NO GROWTH
Culture: NO GROWTH
Special Requests: ADEQUATE
Special Requests: ADEQUATE

## 2020-04-21 ENCOUNTER — Other Ambulatory Visit: Payer: Self-pay

## 2020-04-21 ENCOUNTER — Encounter (HOSPITAL_COMMUNITY): Payer: Self-pay

## 2020-04-21 ENCOUNTER — Emergency Department (HOSPITAL_COMMUNITY)
Admission: EM | Admit: 2020-04-21 | Discharge: 2020-04-21 | Disposition: A | Discharge status: Home / Self Care | Payer: Medicaid Other | Attending: Emergency Medicine | Admitting: Emergency Medicine

## 2020-04-21 DIAGNOSIS — L02411 Cutaneous abscess of right axilla: Secondary | ICD-10-CM | POA: Insufficient documentation

## 2020-04-21 DIAGNOSIS — L732 Hidradenitis suppurativa: Secondary | ICD-10-CM | POA: Insufficient documentation

## 2020-04-21 DIAGNOSIS — F17200 Nicotine dependence, unspecified, uncomplicated: Secondary | ICD-10-CM | POA: Insufficient documentation

## 2020-04-21 MED ORDER — IBUPROFEN 800 MG PO TABS
800.0000 mg | ORAL_TABLET | Freq: Once | ORAL | Status: AC
  Administered 2020-04-21: 800 mg via ORAL
  Filled 2020-04-21: qty 2

## 2020-04-21 MED ORDER — NAPROXEN 500 MG PO TABS: 500.0000 mg | ORAL_TABLET | Freq: Two times a day (BID) | ORAL | 0 refills | Status: DC | PRN

## 2020-04-21 MED ORDER — DOXYCYCLINE HYCLATE 100 MG PO CAPS: 100.0000 mg | ORAL_CAPSULE | Freq: Two times a day (BID) | ORAL | 0 refills | Status: DC

## 2020-04-21 MED ORDER — LIDOCAINE HCL (PF) 1 % IJ SOLN
10.0000 mL | Freq: Once | INTRAMUSCULAR | Status: AC
  Administered 2020-04-21: 10 mL
  Filled 2020-04-21: qty 10

## 2020-04-21 MED ORDER — OXYCODONE-ACETAMINOPHEN 5-325 MG PO TABS
1.0000 | ORAL_TABLET | Freq: Once | ORAL | Status: AC
  Administered 2020-04-21: 1 via ORAL
  Filled 2020-04-21: qty 1

## 2020-04-21 MED ORDER — DOXYCYCLINE HYCLATE 100 MG PO TABS
100.0000 mg | ORAL_TABLET | Freq: Once | ORAL | Status: AC
  Administered 2020-04-21: 100 mg via ORAL
  Filled 2020-04-21: qty 1

## 2020-04-21 NOTE — Discharge Instructions (Signed)
You were seen in the emergency department for a skin abscess- please see the attached handout for further information regarding this diagnoses. This area was incised and drained to help release the bacteria. We would like you to apply warm compresses to this area at least 5 times per day to help facilitate further draining as needed. We are also starting you on doxycycline, an antibiotic, in order to help treat the infection. We are also sending you home with naproxen for pain.   - Naproxen is a nonsteroidal anti-inflammatory medication that will help with pain and swelling. Be sure to take this medication as prescribed with food, 1 pill every 12 hours,  It should be taken with food, as it can cause stomach upset, and more seriously, stomach bleeding. Do not take other nonsteroidal anti-inflammatory medications with this such as Advil, Motrin, Aleve, Mobic, Goodie Powder, or Motrin.    You make take Tylenol per over the counter dosing with these medications.   We have prescribed you new medication(s) today. Discuss the medications prescribed today with your pharmacist as they can have adverse effects and interactions with your other medicines including over the counter and prescribed medications. Seek medical evaluation if you start to experience new or abnormal symptoms after taking one of these medicines, seek care immediately if you start to experience difficulty breathing, feeling of your throat closing, facial swelling, or rash as these could be indications of a more serious allergic reaction.   We would like you to have this area rechecked and have the packing removed within 48 hours- please follow up with your primary care provider for this, you can also see urgent care or return to the ER. Marland Kitchen Return to the ER sooner for new or worsening symptoms including, but not limited to increased pain, spreading redness, fevers, inability to keep fluids down, or any other concerns that you may have.    With  this being a recurrent problem you may also follow up with dermatology.   [facility] Dermatologists:   [facility]  3.2 [phone])  Dermatologist  [address] [place] # [place]  [phone]   Dr. Mertha Finders, MD  2.6 [phone])  Dermatologist  [address] [place]  [phone]  [facility]  3.5 (3)  [facility]  [address] [place]  [phone]   [facility]  4.0 (4)  Dermatologist  [address]  [phone]  Janalyn Harder MD  3.0 (2)  Dermatologist  [address]  [phone]  Hoyle Sauer  2.7 (6)  Dermatologist  [address] [place]  [phone]  [place] Amy Y MD  2.0 (1)  Dermatologist  [address] [place]  [phone]  [facility]  5.0 (3)  Doctor  [address]  [phone]

## 2020-04-21 NOTE — ED Provider Notes (Signed)
Emergency Medicine Provider Triage Evaluation Note   HPI R axillary swelling and pain x 2 days, worsening. Hx of same which usually drains spontaneously. Has been trying warm compresses without relief. Denies trauma, fevers, CP, SOB, extremity numbness or paresthesias.  Objective Blood pressure 125/83, pulse 84, temperature 98.3 F (36.8 C), temperature source Oral, resp. rate 18, SpO2 99 %.  Physical Exam HENT:     Head: Atraumatic.  Pulmonary:     Effort: Pulmonary effort is normal. No respiratory distress.  Musculoskeletal:        General: Normal range of motion.  Skin:    Comments: Erythematous and fluctuant area to R axilla. No lymphangitic streaking or active drainage.  Neurological:     Mental Status: She is alert and oriented to person, place, and time.     Coordination: Coordination normal.    Medical Decision Making Medically screening exam initiated at 1:00 AM  Appropriate orders placed.  VALISSA LYVERS was informed that the remainder of the evaluation will be completed by another provider, this initial triage assessment does not replace that evaluation, and the importance of remaining in the ED until their evaluation is complete.  Clinical Impression Hidradenitis, R axillary abscess.   Antony Madura, PA-C 04/21/20 0105    Tegeler, Canary Brim, MD 04/21/20 [phone]

## 2020-04-21 NOTE — ED Provider Notes (Signed)
[facility] EMERGENCY DEPARTMENT Provider Note   CSN: [id number] Arrival date & time: 04/21/20  0053     History Chief Complaint  Patient presents with  . Abscess    Megan Simmons is a 25 y.o. female with a hx of hidradenitis suppurativa who presents to the ED with complaints of right axillary abscess x 2 days. Patient reports recurrent abscesses to the axilla, usually drain on their own, but this one has not. It is painful. No alleviating/aggravating factors. Denies fever, chills, nausea, vomiting, chest pain, dyspnea, numbness, or weakness. Denies chance of pregnancy.   HPI     Past Medical History:  Diagnosis Date  . Adjustment disorder with depressed mood 02/13/11   referred for counseling  . Constipation, chronic   . Depression   . Hidradenitis suppurativa    Mom not totally sure but described problem consistent with this diagnosis.  . Irregular menses   . Pelvic inflammatory disease (PID) 10/07/2013    Patient Active Problem List   Diagnosis Date Noted  . Abscess of right index finger 09/20/2018  . Obesity 06/23/2015  . Hx: UTI (urinary tract infection) 06/23/2015  . Surveillance of implantable subdermal contraceptive 08/19/2014  . Vitamin D deficiency 03/26/2014  . Depressed mood 03/26/2014  . Hidradenitis suppurativa 10/07/2013  . Acne 12/16/2012    Past Surgical History:  Procedure Laterality Date  . I & D EXTREMITY Right 09/20/2018   Procedure: IRRIGATION AND DEBRIDEMENT INDEX FINGER;  Surgeon: Dominica Severin, MD;  Location: [facility];  Service: Orthopedics;  Laterality: Right;  . INCISION AND DRAINAGE ABSCESS Right 05/28/2013   Procedure: INCISION AND DRAINAGE SACRAL ABSCESS;  Surgeon: [name]. [name], MD;  Location: [facility];  Service: Pediatrics;  Laterality: Right;     OB History   No obstetric history on file.     Family History  Problem Relation Age of Onset  . Asthma Mother   . Hypertension Mother   . ADD  / ADHD Mother   . Eczema Mother   . Diabetes Other   . Hypertension Other     Social History   Tobacco Use  . Smoking status: Light Tobacco Smoker  . Smokeless tobacco: Never Used  Substance Use Topics  . Alcohol use: Yes    Alcohol/week: 0.0 standard drinks    Comment: OCC  . Drug use: Yes    Types: Marijuana    Home Medications Prior to Admission medications   Medication Sig Start Date End Date Taking? Authorizing Provider  acetaminophen (TYLENOL) 325 MG tablet Take 2 tablets (650 mg total) by mouth every 6 (six) hours as needed for mild pain or moderate pain. 09/22/18   Lennox Solders, MD  diphenhydrAMINE (BENADRYL) 25 MG tablet Take 25 mg by mouth every 6 (six) hours as needed for allergies.    [provider]  diphenhydrAMINE-Zinc Acetate (BENADRYL EX) Apply 1 application topically daily as needed (itching).    [provider]  etonogestrel (NEXPLANON) 68 MG IMPL implant 68 mg by Subdermal route once. Implanted December 2019    [provider]    Allergies    Patient has no known allergies.  Review of Systems   Review of Systems  Constitutional: Negative for chills and fever.  Respiratory: Negative for shortness of breath.   Cardiovascular: Negative for chest pain.  Gastrointestinal: Negative for abdominal pain, nausea and vomiting.  Skin: Positive for color change and wound.  Neurological: Negative for weakness and numbness.  All  other systems reviewed and are negative.   Physical Exam Updated Vital Signs BP 109/70   Pulse 75   Temp 98.1 F (36.7 C) (Oral)   Resp 18   SpO2 100%   Physical Exam Vitals and nursing note reviewed.  Constitutional:      General: She is not in acute distress.    Appearance: She is well-developed.  HENT:     Head: Normocephalic and atraumatic.  Eyes:     General:        Right eye: No discharge.        Left eye: No discharge.     Conjunctiva/sclera: Conjunctivae normal.  Cardiovascular:      Rate and Rhythm: Normal rate.     Comments: 2+ symmetric radial pulses.  Pulmonary:     Effort: Pulmonary effort is normal.  Musculoskeletal:     Comments: Right axilla: Patient has a 3.5 cm diameter area of fluctuance with overlying & surrounding erythema with surrounding induration. Findings consistent w/ hidradenitis suppurative. Fluctuant area is tender to palpation. No lymphangitic streaking.   Neurological:     Mental Status: She is alert.     Comments: Clear speech. Sensation grossly intact to bilateral upper extremities. 5/5 symmetric grip strength.   Psychiatric:        Behavior: Behavior normal.        Thought Content: Thought content normal.    ED Results / Procedures / Treatments   Labs (all labs ordered are listed, but only abnormal results are displayed) Labs Reviewed - No data to display  EKG None  Radiology No results found.  Procedures .Marland KitchenIncision and Drainage  Date/Time: 04/21/2020 5:42 AM Performed by: Cherly Anderson, PA-C Authorized by: Cherly Anderson, PA-C   Consent:    Consent obtained:  Verbal   Consent given by:  Patient   Risks, benefits, and alternatives were discussed: yes     Risks discussed:  Bleeding, damage to other organs, infection, incomplete drainage and pain   Alternatives discussed:  Alternative treatment and no treatment Location:    Type:  Abscess   Size:  3.5   Location: right axilla. Pre-procedure details:    Skin preparation:  Povidone-iodine Anesthesia:    Anesthesia method:  Local infiltration   Local anesthetic:  Lidocaine 1% w/o epi Procedure type:    Complexity:  Simple Procedure details:    Incision types:  Stab incision   Wound management:  Probed and deloculated and irrigated with saline   Drainage:  Bloody and purulent   Drainage amount:  Moderate   Wound treatment:  Wound left open   Packing materials:  1/4 in iodoform gauze Post-procedure details:    Procedure completion:  Tolerated well, no  immediate complications     Medications Ordered in ED Medications  ibuprofen (ADVIL) tablet 800 mg (800 mg Oral Given 04/21/20 0105)  lidocaine (PF) (XYLOCAINE) 1 % injection 10 mL (10 mLs Infiltration Given 04/21/20 0447)    ED Course  I have reviewed the triage vital signs and the nursing notes.  Pertinent labs & imaging results that were available during my care of the patient were reviewed by me and considered in my medical decision making (see chart for details).    MDM Rules/Calculators/A&P                          Patient presents to the ED with abscess amenable to I&D based on exam . Procedure per note above.  Given mild surrounding cellulitis and her hidradenitis suppurativa will start patient on Doxycycline, naproxen prescription for pain/swelling.. Recommended application of warm compresses/soaks/flushing. Wound recheck & packing removal in 2 days. I discussed treatment plan, need for follow-up, and return precautions with the patient. Provided opportunity for questions, patient confirmed understanding and is in agreement with plan.   Final Clinical Impression(s) / ED Diagnoses Final diagnoses:  Abscess of right axilla  Hidradenitis suppurativa    Rx / DC Orders ED Discharge Orders         Ordered    doxycycline (VIBRAMYCIN) 100 MG capsule  2 times daily        04/21/20 0546    naproxen (NAPROSYN) 500 MG tablet  2 times daily PRN        04/21/20 0546           Adysson Revelle, Pleas Koch, PA-C 04/21/20 0549    Tegeler, Canary Brim, MD 04/21/20 [phone]

## 2020-04-21 NOTE — ED Triage Notes (Signed)
Pt has abscesses under right arm. Pt states she has had same issue for past 10-11 years. Pt states was given antibiotics in past w/o relief. Pt denies fevers at home.

## 2020-12-05 IMAGING — DX DG FINGER INDEX 2+V*R*
3 series · 3 of 3 positions shown · non-contrast
Comparison: None.

CLINICAL DATA: Pt states R index finger begun to swell from tip of
DIP to MCP joint area x yesterday. Patient reports R finger pain
since earlier this year, however noticed new swelling and redness
extending from distal finger to hand yesterday at work. Limited
range of motion.

EXAM:
RIGHT INDEX FINGER 2+V

[finger ap]
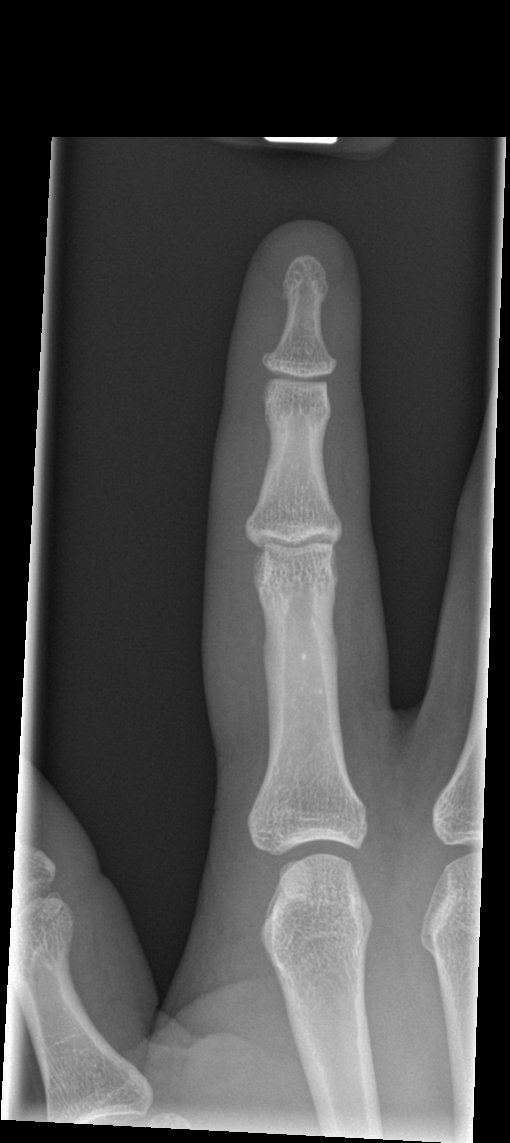

[finger obl]
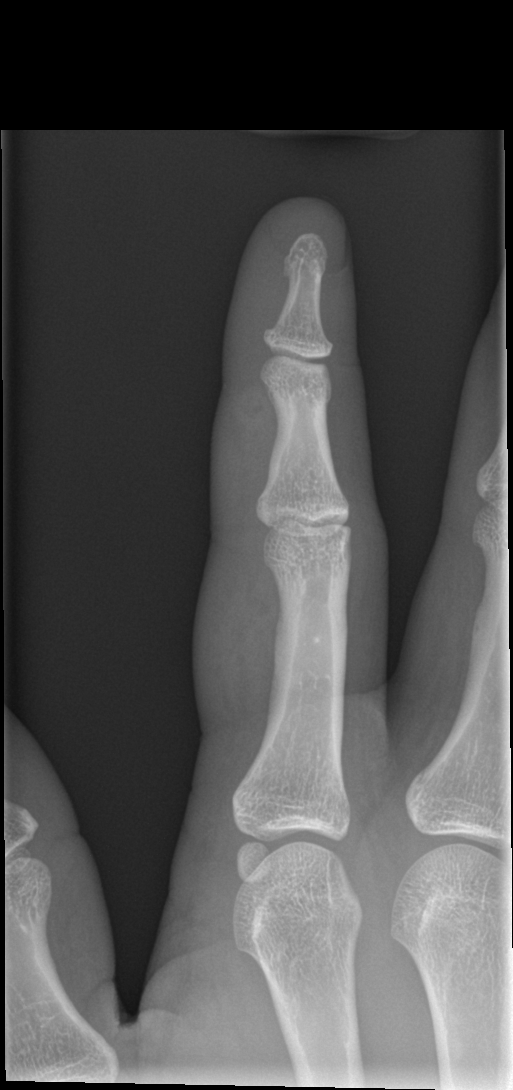

[finger lat]
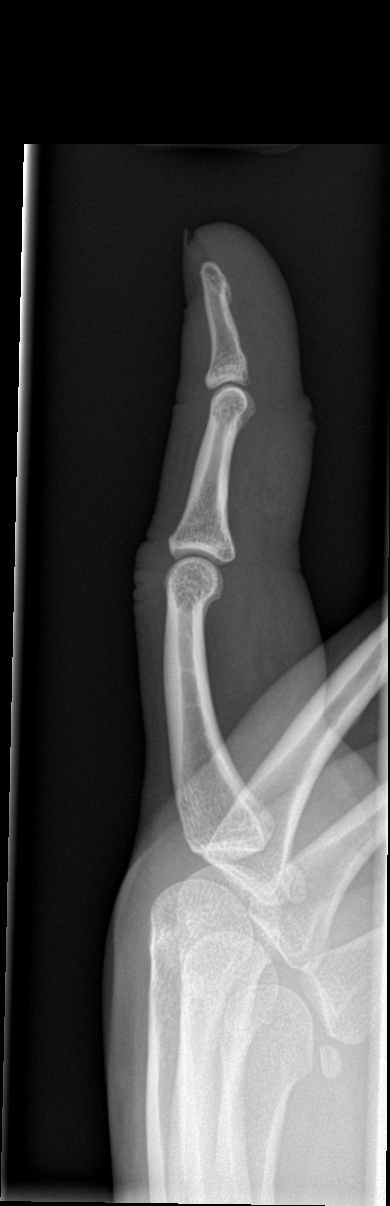

[3 of 3 positions shown; findings below may reference images not displayed]

FINDINGS: There is soft tissue swelling of the index finger. Soft tissue
irregularity is identified along the volar aspect of the distal
interphalangeal joint. No radiopaque foreign body or soft tissue
gas. No acute fracture or osseous erosions.
IMPRESSION: Soft tissue swelling of the index finger.

## 2020-12-27 ENCOUNTER — Other Ambulatory Visit: Payer: Self-pay

## 2020-12-27 ENCOUNTER — Ambulatory Visit (HOSPITAL_COMMUNITY)
Admission: EM | Admit: 2020-12-27 | Discharge: 2020-12-27 | Disposition: A | Discharge status: Home / Self Care | Payer: 59 | Attending: Emergency Medicine | Admitting: Emergency Medicine

## 2020-12-27 ENCOUNTER — Encounter (HOSPITAL_COMMUNITY): Payer: Self-pay | Admitting: Emergency Medicine

## 2020-12-27 DIAGNOSIS — J029 Acute pharyngitis, unspecified: Secondary | ICD-10-CM | POA: Insufficient documentation

## 2020-12-27 LAB — POCT RAPID STREP A, ED / UC: Streptococcus, Group A Screen (Direct): NEGATIVE

## 2020-12-27 MED ORDER — METHYLPREDNISOLONE SODIUM SUCC 125 MG IJ SOLR
60.0000 mg | Freq: Once | INTRAMUSCULAR | Status: AC
  Administered 2020-12-27: 60 mg via INTRAMUSCULAR

## 2020-12-27 MED ORDER — METHYLPREDNISOLONE SODIUM SUCC 125 MG IJ SOLR
INTRAMUSCULAR | Status: AC
  Filled 2020-12-27: qty 2

## 2020-12-27 MED ORDER — LIDOCAINE VISCOUS HCL 2 % MT SOLN: 15.0000 mL | OROMUCOSAL | 0 refills | Status: DC | PRN

## 2020-12-27 MED ORDER — IBUPROFEN 800 MG PO TABS: 800.0000 mg | ORAL_TABLET | Freq: Three times a day (TID) | ORAL | 0 refills | Status: DC

## 2020-12-27 NOTE — Discharge Instructions (Addendum)
Your rapid strep test today was negative, your throat sample has been sent to the lab to see if it will grow bacteria, if this occurs you will be notified and antibiotic be sent in for use ° °In the meantime we must treat this as a viral symptom and manage the symptoms ° °You may gargle and spit lidocaine solution every 4 hours as needed for temporary relief of your sore throat ° °May use ibuprofen every 8 hours as needed in addition to Tylenol for additional comfort ° °You may follow-up at urgent care as needed  °

## 2020-12-27 NOTE — ED Triage Notes (Signed)
Pt is present today with sore throat and sinus pressure. Pt states that sx started Friday

## 2020-12-27 NOTE — ED Provider Notes (Signed)
[facility]    CSN: [id number] Arrival date & time: 12/27/20  1628      History   Chief Complaint Chief Complaint  Patient presents with   Sore Throat   Facial Pain    HPI Megan Simmons is a 25 y.o. female.   Patient presents with nasal congestion, sinus pain and pressure and sore throat for 4 days.  Endorses that sore throat worsened yesterday.  Poor appetite, painful to swallow.  Has attempted use of sinus medicine and vicks cold and flu, not helpful. Possible sick contacts due to job.   Past Medical History:  Diagnosis Date   Adjustment disorder with depressed mood 02/13/11   referred for counseling   Constipation, chronic    Depression    Hidradenitis suppurativa    Mom not totally sure but described problem consistent with this diagnosis.   Irregular menses    Pelvic inflammatory disease (PID) 10/07/2013    Patient Active Problem List   Diagnosis Date Noted   Abscess of right index finger 09/20/2018   Obesity 06/23/2015   Hx: UTI (urinary tract infection) 06/23/2015   Surveillance of implantable subdermal contraceptive 08/19/2014   Vitamin D deficiency 03/26/2014   Depressed mood 03/26/2014   Hidradenitis suppurativa 10/07/2013   Acne 12/16/2012    Past Surgical History:  Procedure Laterality Date   I & D EXTREMITY Right 09/20/2018   Procedure: IRRIGATION AND DEBRIDEMENT INDEX FINGER;  Surgeon: Dominica Severin, MD;  Location: [facility];  Service: Orthopedics;  Laterality: Right;   INCISION AND DRAINAGE ABSCESS Right 05/28/2013   Procedure: INCISION AND DRAINAGE SACRAL ABSCESS;  Surgeon: [name]. [name], MD;  Location: [facility];  Service: Pediatrics;  Laterality: Right;    OB History   No obstetric history on file.      Home Medications    Prior to Admission medications   Medication Sig Start Date End Date Taking? Authorizing Provider  acetaminophen (TYLENOL) 325 MG tablet Take 2 tablets (650 mg total) by mouth every 6  (six) hours as needed for mild pain or moderate pain. 09/22/18   Lennox Solders, MD  diphenhydrAMINE (BENADRYL) 25 MG tablet Take 25 mg by mouth every 6 (six) hours as needed for allergies.    [provider]  diphenhydrAMINE-Zinc Acetate (BENADRYL EX) Apply 1 application topically daily as needed (itching).    [provider]  doxycycline (VIBRAMYCIN) 100 MG capsule Take 1 capsule (100 mg total) by mouth 2 (two) times daily. 04/21/20   Petrucelli, Pleas Koch, PA-C  etonogestrel (NEXPLANON) 68 MG IMPL implant 68 mg by Subdermal route once. Implanted December 2019    [provider]  naproxen (NAPROSYN) 500 MG tablet Take 1 tablet (500 mg total) by mouth 2 (two) times daily as needed for moderate pain. 04/21/20   Petrucelli, Pleas Koch, PA-C    Family History Family History  Problem Relation Age of Onset   Asthma Mother    Hypertension Mother    ADD / ADHD Mother    Eczema Mother    Diabetes Other    Hypertension Other     Social History Social History   Tobacco Use   Smoking status: Light Smoker   Smokeless tobacco: Never  Substance Use Topics   Alcohol use: Yes    Alcohol/week: 0.0 standard drinks    Comment: OCC   Drug use: Yes    Types: Marijuana     Allergies   Patient has no known allergies.  Review of Systems Review of Systems  Constitutional: Negative.   HENT:  Positive for congestion, sinus pressure, sinus pain and sore throat. Negative for dental problem, drooling, ear discharge, ear pain, facial swelling, hearing loss, mouth sores, nosebleeds, postnasal drip, rhinorrhea, sneezing, tinnitus, trouble swallowing and voice change.   Respiratory: Negative.    Cardiovascular: Negative.   Gastrointestinal: Negative.   Skin: Negative.   Neurological: Negative.     Physical Exam Triage Vital Signs ED Triage Vitals  Enc Vitals Group     BP 12/27/20 1725 (!) 157/114     Pulse Rate 12/27/20 1725 82     Resp 12/27/20 1725 17     Temp  12/27/20 1725 98.4 F (36.9 C)     Temp Source 12/27/20 1725 Oral     SpO2 12/27/20 1725 98 %     Weight --      Height --      Head Circumference --      Peak Flow --      Pain Score 12/27/20 1724 9     Pain Loc --      Pain Edu? --      Excl. in GC? --    No data found.  Updated Vital Signs BP (!) 157/114 (BP Location: Right Arm)    Pulse 82    Temp 98.4 F (36.9 C) (Oral)    Resp 17    SpO2 98%   Visual Acuity Right Eye Distance:   Left Eye Distance:   Bilateral Distance:    Right Eye Near:   Left Eye Near:    Bilateral Near:     Physical Exam Constitutional:      Appearance: She is well-developed and normal weight.  HENT:     Head: Normocephalic.     Right Ear: Tympanic membrane and ear canal normal.     Left Ear: Tympanic membrane and ear canal normal.     Nose: Congestion present. No rhinorrhea.     Mouth/Throat:     Mouth: Mucous membranes are moist.     Pharynx: Oropharynx is clear.     Tonsils: No tonsillar exudate or tonsillar abscesses. 3+ on the right. 3+ on the left.  Cardiovascular:     Rate and Rhythm: Normal rate and regular rhythm.     Heart sounds: Normal heart sounds.  Pulmonary:     Effort: Pulmonary effort is normal.     Breath sounds: Normal breath sounds.  Musculoskeletal:     Cervical back: Normal range of motion.  Lymphadenopathy:     Cervical: Cervical adenopathy present.  Skin:    General: Skin is warm and dry.  Neurological:     General: No focal deficit present.     Mental Status: She is alert and oriented to person, place, and time.  Psychiatric:        Mood and Affect: Mood normal.        Behavior: Behavior normal.     UC Treatments / Results  Labs (all labs ordered are listed, but only abnormal results are displayed) Labs Reviewed - No data to display  EKG   Radiology No results found.  Procedures Procedures (including critical care time)  Medications Ordered in UC Medications - No data to display  Initial  Impression / Assessment and Plan / UC Course  I have reviewed the triage vital signs and the nursing notes.  Pertinent labs & imaging results that were available during my care of the patient were reviewed  by me and considered in my medical decision making (see chart for details).  Viral pharyngitis  1.  Methylprednisolone 60 mg IM nail 2.  Rapid strep negative, sent for culture 3.  Ibuprofen 800 mg 3 times daily as needed 4.  Lidocaine viscous 2% 15 mL every 4 hours as needed 5.  Urgent care follow-up as needed, work note given to return tomorrow Final Clinical Impressions(s) / UC Diagnoses   Final diagnoses:  None   Discharge Instructions   None    ED Prescriptions   None    PDMP not reviewed this encounter.   Valinda Hoar, NP 12/27/20 1756

## 2020-12-30 LAB — CULTURE, GROUP A STREP (THRC)

## 2021-01-02 ENCOUNTER — Other Ambulatory Visit: Payer: Self-pay

## 2021-01-02 ENCOUNTER — Emergency Department (HOSPITAL_COMMUNITY)
Admission: EM | Admit: 2021-01-02 | Discharge: 2021-01-02 | Disposition: A | Discharge status: Home / Self Care | Payer: 59 | Attending: Emergency Medicine | Admitting: Emergency Medicine

## 2021-01-02 DIAGNOSIS — J029 Acute pharyngitis, unspecified: Secondary | ICD-10-CM | POA: Diagnosis present

## 2021-01-02 DIAGNOSIS — Z20822 Contact with and (suspected) exposure to covid-19: Secondary | ICD-10-CM | POA: Diagnosis not present

## 2021-01-02 DIAGNOSIS — F17200 Nicotine dependence, unspecified, uncomplicated: Secondary | ICD-10-CM | POA: Insufficient documentation

## 2021-01-02 LAB — RESP PANEL BY RT-PCR (FLU A&B, COVID) ARPGX2
Influenza A by PCR: NEGATIVE
Influenza B by PCR: NEGATIVE
SARS Coronavirus 2 by RT PCR: NEGATIVE

## 2021-01-02 LAB — MONONUCLEOSIS SCREEN: Mono Screen: NEGATIVE

## 2021-01-02 MED ORDER — DEXAMETHASONE SODIUM PHOSPHATE 10 MG/ML IJ SOLN
10.0000 mg | Freq: Once | INTRAMUSCULAR | Status: AC
  Administered 2021-01-02: 22:00:00 10 mg via INTRAMUSCULAR
  Filled 2021-01-02: qty 1

## 2021-01-02 NOTE — ED Notes (Signed)
X2 for room assignment with no response 

## 2021-01-02 NOTE — ED Triage Notes (Signed)
Pt reported to ED with c/o ongoing sore throat, sates she was seen at urgent care and tested negative last week with no relief in symptoms. Requesting re-evaluation for the same.

## 2021-01-02 NOTE — ED Provider Notes (Signed)
[place] EMERGENCY DEPARTMENT Provider Note  CSN: [id number] Arrival date & time: 01/02/21 1842    History Chief Complaint  Patient presents with   Sore Throat    Megan Simmons is a 25 y.o. female reports about a week of sore throat, congestion, pain worse with swallowing. She saw UC last week and had neg strep, given a single dose of solumedrol with some improvement but pain worsened again. No fever. No cough.    Past Medical History:  Diagnosis Date   Adjustment disorder with depressed mood 02/13/11   referred for counseling   Constipation, chronic    Depression    Hidradenitis suppurativa    Mom not totally sure but described problem consistent with this diagnosis.   Irregular menses    Pelvic inflammatory disease (PID) 10/07/2013    Past Surgical History:  Procedure Laterality Date   I & D EXTREMITY Right 09/20/2018   Procedure: IRRIGATION AND DEBRIDEMENT INDEX FINGER;  Surgeon: Dominica Severin, MD;  Location: [facility];  Service: Orthopedics;  Laterality: Right;   INCISION AND DRAINAGE ABSCESS Right 05/28/2013   Procedure: INCISION AND DRAINAGE SACRAL ABSCESS;  Surgeon: [name]. [name], MD;  Location: [facility];  Service: Pediatrics;  Laterality: Right;    Family History  Problem Relation Age of Onset   Asthma Mother    Hypertension Mother    ADD / ADHD Mother    Eczema Mother    Diabetes Other    Hypertension Other     Social History   Tobacco Use   Smoking status: Light Smoker   Smokeless tobacco: Never  Substance Use Topics   Alcohol use: Yes    Alcohol/week: 0.0 standard drinks    Comment: OCC   Drug use: Yes    Types: Marijuana     Home Medications Prior to Admission medications   Medication Sig Start Date End Date Taking? Authorizing Provider  etonogestrel (NEXPLANON) 68 MG IMPL implant 68 mg by Subdermal route once. Implanted December 2019    [provider]  ibuprofen (ADVIL) 800 MG tablet Take 1 tablet (800  mg total) by mouth 3 (three) times daily. 12/27/20   White, Elita Boone, NP  lidocaine (XYLOCAINE) 2 % solution Use as directed 15 mLs in the mouth or throat as needed for mouth pain. 12/27/20   White, Elita Boone, NP  naproxen (NAPROSYN) 500 MG tablet Take 1 tablet (500 mg total) by mouth 2 (two) times daily as needed for moderate pain. 04/21/20   Petrucelli, Pleas Koch, PA-C     Allergies    Patient has no known allergies.   Review of Systems   Review of Systems A comprehensive review of systems was completed and negative except as noted in HPI.    Physical Exam BP (!) 141/86 (BP Location: Left Arm)    Pulse 69    Temp 98.5 F (36.9 C) (Oral)    Resp 16    Ht 5\' 8"  (1.727 m)    Wt 124.3 kg    SpO2 94%    BMI 41.66 kg/m   Physical Exam Vitals and nursing note reviewed.  Constitutional:      Appearance: Normal appearance.  HENT:     Head: Normocephalic and atraumatic.     Nose: Nose normal.     Mouth/Throat:     Mouth: Mucous membranes are moist.     Tonsils: No tonsillar exudate or tonsillar abscesses. 2+ on the right. 2+ on the left.  Eyes:  Extraocular Movements: Extraocular movements intact.     Conjunctiva/sclera: Conjunctivae normal.  Cardiovascular:     Rate and Rhythm: Normal rate.  Pulmonary:     Effort: Pulmonary effort is normal.     Breath sounds: Normal breath sounds.  Abdominal:     General: Abdomen is flat.     Palpations: Abdomen is soft.     Tenderness: There is no abdominal tenderness.  Musculoskeletal:        General: No swelling. Normal range of motion.     Cervical back: Neck supple.  Lymphadenopathy:     Cervical: No cervical adenopathy.  Skin:    General: Skin is warm and dry.  Neurological:     General: No focal deficit present.     Mental Status: She is alert.  Psychiatric:        Mood and Affect: Mood normal.     ED Results / Procedures / Treatments   Labs (all labs ordered are listed, but only abnormal results are displayed) Labs  Reviewed  RESP PANEL BY RT-PCR (FLU A&B, COVID) ARPGX2  GROUP A STREP BY PCR  MONONUCLEOSIS SCREEN    EKG None  Radiology No results found.  Procedures Procedures  Medications Ordered in the ED Medications  dexamethasone (DECADRON) injection 10 mg (has no administration in time range)     MDM Rules/Calculators/A&P MDM Mono test is negative. Covid/Flu and Strep PCR will be delayed due to the machine being down. Patient informed that she will need to get her results in MyChart. Decadron for longer term anti-inflammatory. Continue NSAIDs. Referral to ENT for consideration of tonsillectomy if symptoms persist.   ED Course  I have reviewed the triage vital signs and the nursing notes.  Pertinent labs & imaging results that were available during my care of the patient were reviewed by me and considered in my medical decision making (see chart for details).     Final Clinical Impression(s) / ED Diagnoses Final diagnoses:  Sore throat    Rx / DC Orders ED Discharge Orders     None        Pollyann Savoy, MD 01/02/21 2209

## 2021-01-02 NOTE — ED Provider Notes (Signed)
Emergency Medicine Provider Triage Evaluation Note  Megan Simmons , a 25 y.o. female  was evaluated in triage.  Pt complains of sore throat.  Seen at an urgent care last week and had a negative strep test.  She has resistant sore throat.  Review of Systems  Positive: Sore throat Negative: Fever  Physical Exam  BP (!) 141/86 (BP Location: Left Arm)    Pulse 69    Temp 98.5 F (36.9 C) (Oral)    Resp 16    SpO2 94%  Gen:   Awake, no distress   Resp:  Normal effort  MSK:   Moves extremities without difficulty  Other:  Welling and erythema of the posterior oropharynx  Medical Decision Making  Medically screening exam initiated at 8:02 PM.  Appropriate orders placed.  ANABIA WEATHERWAX was informed that the remainder of the evaluation will be completed by another provider, this initial triage assessment does not replace that evaluation, and the importance of remaining in the ED until their evaluation is complete.  Sore throat.  Labs ordered   Arthor Captain, Cordelia Poche 01/02/21 2005    Pollyann Savoy, MD 01/02/21 2233

## 2021-01-03 LAB — GROUP A STREP BY PCR: Group A Strep by PCR: NOT DETECTED

## 2021-05-10 ENCOUNTER — Emergency Department (HOSPITAL_COMMUNITY)
Admission: EM | Admit: 2021-05-10 | Discharge: 2021-05-10 | Disposition: A | Discharge status: Home / Self Care | Payer: 59 | Attending: Emergency Medicine | Admitting: Emergency Medicine

## 2021-05-10 DIAGNOSIS — Z20822 Contact with and (suspected) exposure to covid-19: Secondary | ICD-10-CM | POA: Insufficient documentation

## 2021-05-10 DIAGNOSIS — J029 Acute pharyngitis, unspecified: Secondary | ICD-10-CM | POA: Diagnosis not present

## 2021-05-10 LAB — MONONUCLEOSIS SCREEN: Mono Screen: NEGATIVE

## 2021-05-10 LAB — GROUP A STREP BY PCR: Group A Strep by PCR: NOT DETECTED

## 2021-05-10 LAB — RESP PANEL BY RT-PCR (FLU A&B, COVID) ARPGX2
Influenza A by PCR: NEGATIVE
Influenza B by PCR: NEGATIVE
SARS Coronavirus 2 by RT PCR: NEGATIVE

## 2021-05-10 MED ORDER — DEXAMETHASONE 4 MG PO TABS
6.0000 mg | ORAL_TABLET | Freq: Once | ORAL | Status: AC
Start: 2021-05-10 — End: 2021-05-10
  Administered 2021-05-10: 6 mg via ORAL
  Filled 2021-05-10: qty 2

## 2021-05-10 NOTE — Discharge Instructions (Addendum)
You were evaluated in the Emergency Department and after careful evaluation, we did not find any emergent condition requiring admission or further testing in the hospital. ? ?Your exam/testing today was overall reassuring.  Your COVID-19 PCR testing, influenza testing, mononucleosis screen and group A strep PCR testing was negative.  Symptoms are likely a viral infection.  Follow-up on our patient portal the results of your throat culture and GC/chlamydia screen. ? ?Please return to the Emergency Department if you experience any worsening of your condition.  Thank you for allowing [place] to be a part of your care. ? ?

## 2021-05-10 NOTE — ED Triage Notes (Signed)
Pt. Stated, I started having sore throat last night and Ive been around someone with strep throat. ?

## 2021-05-10 NOTE — ED Provider Notes (Signed)
?[facility] EMERGENCY DEPARTMENT ?Provider Note ? ? ?CSN: [id number] ?Arrival date & time: 05/10/21  0750 ? ?  ? ?History ? ?Chief Complaint  ?Patient presents with  ? Sore Throat  ? ? ?Megan Simmons is a 26 y.o. female. ? ? ?Sore Throat ? ? ?26 year old female presenting to the emergency department with a chief complaint of sore throat.  The patient states that a friend of hers was recently diagnosed and treated for strep throat.  She states that last night she began to have a bilateral sore throat.  No difficulty swallowing or breathing.  No asymmetry to her sore throat.  No chest pain or shortness of breath.  No fevers or chills.  No elevation of the tongue or swelling of the lips.  She denies a cough or any other upper respiratory symptoms. ? ?She states that she had a similar episode of a sore throat in December and was seen twice in the emergency department with negative rapid strep test and a negative monoscreen.  She states that she has not been sexually active in some time.  She consents to STI testing. ? ?Home Medications ?Prior to Admission medications   ?Medication Sig Start Date End Date Taking? Authorizing Provider  ?etonogestrel (NEXPLANON) 68 MG IMPL implant 68 mg by Subdermal route once. Implanted December 2019    [provider]  ?ibuprofen (ADVIL) 800 MG tablet Take 1 tablet (800 mg total) by mouth 3 (three) times daily. 12/27/20   Valinda Hoar, NP  ?lidocaine (XYLOCAINE) 2 % solution Use as directed 15 mLs in the mouth or throat as needed for mouth pain. 12/27/20   Valinda Hoar, NP  ?naproxen (NAPROSYN) 500 MG tablet Take 1 tablet (500 mg total) by mouth 2 (two) times daily as needed for moderate pain. 04/21/20   Petrucelli, Pleas Koch, PA-C  ?   ? ?Allergies    ?Patient has no known allergies.   ? ?Review of Systems   ?Review of Systems  ?HENT:  Positive for sore throat.   ?All other systems reviewed and are negative. ? ?Physical Exam ?Updated Vital  Signs ?BP 133/63 (BP Location: Right Arm)   Pulse 62   Temp 99 ?F (37.2 ?C) (Oral)   Resp 20   Ht 5\' 7"  (1.702 m)   Wt 126.6 kg   SpO2 99%   BMI 43.70 kg/m?  ?Physical Exam ?Vitals and nursing note reviewed.  ?Constitutional:   ?   General: She is not in acute distress. ?   Appearance: She is well-developed.  ?HENT:  ?   Head: Normocephalic and atraumatic.  ?   Mouth/Throat:  ?   Mouth: Mucous membranes are moist.  ?   Pharynx: Posterior oropharyngeal erythema present. No pharyngeal swelling or oropharyngeal exudate.  ?   Tonsils: Tonsillar exudate present. No tonsillar abscesses.  ?Eyes:  ?   Conjunctiva/sclera: Conjunctivae normal.  ?Cardiovascular:  ?   Rate and Rhythm: Normal rate and regular rhythm.  ?   Heart sounds: No murmur heard. ?Pulmonary:  ?   Effort: Pulmonary effort is normal. No respiratory distress.  ?   Breath sounds: Normal breath sounds.  ?Abdominal:  ?   Palpations: Abdomen is soft.  ?   Tenderness: There is no abdominal tenderness.  ?Musculoskeletal:     ?   General: No swelling.  ?   Cervical back: Neck supple.  ?Skin: ?   General: Skin is warm and dry.  ?   Capillary Refill:  Capillary refill takes less than 2 seconds.  ?Neurological:  ?   Mental Status: She is alert.  ?Psychiatric:     ?   Mood and Affect: Mood normal.  ? ? ?ED Results / Procedures / Treatments   ?Labs ?(all labs ordered are listed, but only abnormal results are displayed) ?Labs Reviewed  ?GROUP A STREP BY PCR  ?RESP PANEL BY RT-PCR (FLU A&B, COVID) ARPGX2  ?CULTURE, GROUP A STREP [facility])  ?MONONUCLEOSIS SCREEN  ?GC/CHLAMYDIA PROBE AMP (Icard) NOT AT [facility]  ? ? ?EKG ?None ? ?Radiology ?No results found. ? ?Procedures ?Procedures  ? ? ?Medications Ordered in ED ?Medications  ?dexamethasone (DECADRON) tablet 6 mg (has no administration in time range)  ? ? ?ED Course/ Medical Decision Making/ A&P ?  ?                        ?Medical Decision Making ?Amount and/or Complexity of Data Reviewed ?Labs:  ordered. ? ?Risk ?Prescription drug management. ? ? ? ?26 year old female presenting to the emergency department with a chief complaint of sore throat.  The patient states that a friend of hers was recently diagnosed and treated for strep throat.  She states that last night she began to have a bilateral sore throat.  No difficulty swallowing or breathing.  No asymmetry to her sore throat.  No chest pain or shortness of breath.  No fevers or chills.  No elevation of the tongue or swelling of the lips.  She denies a cough or any other upper respiratory symptoms. ? ?She states that she had a similar episode of a sore throat in December and was seen twice in the emergency department with negative rapid strep test and a negative monoscreen.  She states that she has not been sexually active in some time.  She consents to STI testing. ? ?On arrival, the patient was vitally stable, temperature 99, pulse 62, RR 20, BP 133/63, saturating 9 9% on room air.  Sinus rhythm noted on cardiac telemetry. ? ?Physical exam significant for oropharyngeal erythema without significant exudate, mild tonsillar exudate present bilaterally.  No evidence of PTA.  Good range of motion of the neck with low suspicion for RPA, no evidence for Ludwick's angina.  No evidence for allergic reaction or angioedema. ? ?Symptoms most consistent with viral URI.  Considered strep throat, mono, gonococcal pharyngitis although feel less likely.  Initial rapid strep screen was collected and resulted negative.  COVID-19 and influenza PCR screening was collected and pending.  A throat culture was collected and pending. ? ?COVID-19 influenza PCR testing resulted negative, mononucleosis screen negative, strep PCR testing also negative.  Throat culture and GC/chlamydia probe pending.  We will mark the patient for follow-up. Lower concern for STI and after discussion will not empirically treat. Stable for discharge at this time. ? ?Final Clinical Impression(s) / ED  Diagnoses ?Final diagnoses:  ?Acute pharyngitis, unspecified etiology  ? ? ?Rx / DC Orders ?ED Discharge Orders   ? ? None  ? ?  ? ? ?  ?Ernie Avena, MD ?05/10/21 1305 ? ?

## 2021-05-11 LAB — GC/CHLAMYDIA PROBE AMP (~~LOC~~) NOT AT ARMC
Comment: NEGATIVE
Comment: NORMAL

## 2021-05-12 LAB — CULTURE, GROUP A STREP (THRC)

## 2022-01-21 ENCOUNTER — Other Ambulatory Visit: Payer: Self-pay

## 2022-01-21 ENCOUNTER — Emergency Department (HOSPITAL_COMMUNITY)
Admission: EM | Admit: 2022-01-21 | Discharge: 2022-01-21 | Disposition: A | Discharge status: Home / Self Care | Payer: BC Managed Care – PPO | Attending: Emergency Medicine | Admitting: Emergency Medicine

## 2022-01-21 ENCOUNTER — Emergency Department (HOSPITAL_COMMUNITY): Payer: BC Managed Care – PPO

## 2022-01-21 DIAGNOSIS — W19XXXA Unspecified fall, initial encounter: Secondary | ICD-10-CM | POA: Insufficient documentation

## 2022-01-21 DIAGNOSIS — M25572 Pain in left ankle and joints of left foot: Secondary | ICD-10-CM

## 2022-01-21 NOTE — ED Triage Notes (Signed)
Patient reports left ankle pain worse with movement and weight bearing onset last night , denies injury/ambulatory .

## 2022-01-21 NOTE — ED Provider Notes (Signed)
[facility] EMERGENCY DEPARTMENT Provider Note   CSN: [id number] Arrival date & time: 01/21/22  1909     History  Chief Complaint  Patient presents with   Ankle Pain    Megan Simmons is a 27 y.o. female.   Ankle Pain Patient is a 27 year old female with no pertinent past medical history presented emergency room today with complaints of left ankle pain she states that she fell yesterday during the day but did not have immediate onset of pain.  She states that her pain developed later in the evening she states that it is a sharp pain that seems to begin in her heel, at the back of her ankle.  No history of plantar fasciitis in the past.  She is taking her medications she has not seen anybody for her symptoms.     Home Medications Prior to Admission medications   Medication Sig Start Date End Date Taking? Authorizing Provider  etonogestrel (NEXPLANON) 68 MG IMPL implant 68 mg by Subdermal route once. Implanted December 2019    [provider]  ibuprofen (ADVIL) 800 MG tablet Take 1 tablet (800 mg total) by mouth 3 (three) times daily. 12/27/20   White, Leitha Schuller, NP  lidocaine (XYLOCAINE) 2 % solution Use as directed 15 mLs in the mouth or throat as needed for mouth pain. 12/27/20   White, Leitha Schuller, NP  naproxen (NAPROSYN) 500 MG tablet Take 1 tablet (500 mg total) by mouth 2 (two) times daily as needed for moderate pain. 04/21/20   Petrucelli, Glynda Jaeger, PA-C      Allergies    Patient has no known allergies.    Review of Systems   Review of Systems  Physical Exam Updated Vital Signs BP 122/66   Pulse 71   Temp 98.4 F (36.9 C)   Resp 16   SpO2 99%  Physical Exam Vitals and nursing note reviewed.  Constitutional:      General: She is not in acute distress.    Appearance: Normal appearance. She is not ill-appearing.  HENT:     Head: Normocephalic and atraumatic.  Eyes:     General: No scleral icterus.       Right eye: No discharge.         Left eye: No discharge.     Conjunctiva/sclera: Conjunctivae normal.  Cardiovascular:     Comments: DP PT pulses 3+ and symmetric in bilateral feet Pulmonary:     Effort: Pulmonary effort is normal.     Breath sounds: No stridor.  Skin:    General: Skin is warm and dry.     Comments: Foot is warm and perfused with good sensation  Neurological:     Mental Status: She is alert and oriented to person, place, and time. Mental status is at baseline.     ED Results / Procedures / Treatments   Labs (all labs ordered are listed, but only abnormal results are displayed) Labs Reviewed - No data to display  EKG None  Radiology DG Ankle Complete Left  Result Date: 01/21/2022 CLINICAL DATA:  Left ankle pain EXAM: LEFT ANKLE COMPLETE - 3+ VIEW COMPARISON:  None Available. FINDINGS: There is no evidence of fracture, dislocation, or joint effusion. There is no evidence of arthropathy or other focal bone abnormality. Soft tissues are unremarkable. IMPRESSION: Negative. Electronically Signed   By: Fidela Salisbury M.D.   On: 01/21/2022 19:52    Procedures Procedures    Medications Ordered in ED Medications -  No data to display  ED Course/ Medical Decision Making/ A&P                           Medical Decision Making  Patient is a 27 year old female with no pertinent past medical history presented emergency room today with complaints of left ankle pain she states that she fell yesterday during the day but did not have immediate onset of pain.  She states that her pain developed later in the evening she states that it is a sharp pain that seems to begin in her heel, at the back of her ankle.  No history of plantar fasciitis in the past.  She is taking her medications she has not seen anybody for her symptoms.  Distally neurovascularly intact, no ankle tenderness, seems to have pain in the heel that comes off the back of her ankle indicating possibly that this could be plantar fasciitis.  She  is distally neurovascularly intact to sensation and movement wiggles her toes well with good cap refill.  Doubt DVT, arterial injury, she has soft compartments no fractures on her left ankle x-ray which I personally viewed.  I recommend that she follow-up with orthopedics.  Return precautions were provided to patient and recommend that she trial taking Motrin since she has not taken any medications yet.  Recommendations were given also given an ASO brace.  Patient agreeable plan.  Final Clinical Impression(s) / ED Diagnoses Final diagnoses:  Acute left ankle pain    Rx / DC Orders ED Discharge Orders     None         Gailen Shelter, [place] 01/21/22 2335    Dione Booze, MD 01/22/22 [phone]

## 2022-01-21 NOTE — ED Provider Triage Note (Signed)
Emergency Medicine Provider Triage Evaluation Note  Megan Simmons , a 27 y.o. female  was evaluated in triage.  Pt complains of acute left ankle pain.  Started yesterday.  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  Does not believe it looks swollen.  Notes pain when she flexes her ankle towards the ceiling.  Review of Systems  Positive:  Negative: Fevers  Physical Exam  BP 137/68 (BP Location: Right Arm)   Pulse 82   Temp 98.8 F (37.1 C)   Resp 15   SpO2 96%  Gen:   Awake, no distress   Resp:  Normal effort  MSK:   Moves extremities without difficulty with exception of the left ankle.  Noted elicited tenderness with dorsiflexion.  Able to wiggle toes without difficulty. Other:  Circulation and sensation appear grossly intact.  Medical Decision Making  Medically screening exam initiated at 7:19 PM.  Appropriate orders placed.  Megan Simmons was informed that the remainder of the evaluation will be completed by another provider, this initial triage assessment does not replace that evaluation, and the importance of remaining in the ED until their evaluation is complete.     Prince Rome, [place] 46/27/03 1922

## 2022-01-21 NOTE — Discharge Instructions (Addendum)
Ice and elevate your left ankle.  Tylenol and ibuprofen as discussed below  Please use Tylenol or ibuprofen for pain.  You may use 600 mg ibuprofen every 6 hours or 1000 mg of Tylenol every 6 hours.  You may choose to alternate between the 2.  This would be most effective.  Not to exceed 4 g of Tylenol within 24 hours.  Not to exceed 3200 mg ibuprofen 24 hours.

## 2022-01-23 ENCOUNTER — Telehealth: Payer: Self-pay | Admitting: *Deleted

## 2022-01-23 NOTE — Patient Outreach (Signed)
  Care Coordination [facility] Note Transition Care Management Unsuccessful Follow-up Telephone Call  Date of discharge and from where:  01/21/22 from [facility]  Attempts:  1st Attempt  Reason for unsuccessful TCM follow-up call:  No answer/busy   Lurena Joiner RN, [place] RN Care Coordinator

## 2022-02-10 ENCOUNTER — Emergency Department (HOSPITAL_COMMUNITY): Payer: BC Managed Care – PPO

## 2022-02-10 ENCOUNTER — Other Ambulatory Visit: Payer: Self-pay

## 2022-02-10 ENCOUNTER — Emergency Department (HOSPITAL_COMMUNITY)
Admission: EM | Admit: 2022-02-10 | Discharge: 2022-02-10 | Disposition: A | Discharge status: Home / Self Care | Payer: BC Managed Care – PPO | Attending: Emergency Medicine | Admitting: Emergency Medicine

## 2022-02-10 DIAGNOSIS — R7889 Finding of other specified substances, not normally found in blood: Secondary | ICD-10-CM | POA: Diagnosis not present

## 2022-02-10 DIAGNOSIS — R569 Unspecified convulsions: Secondary | ICD-10-CM

## 2022-02-10 LAB — COMPREHENSIVE METABOLIC PANEL
ALT: 14 U/L (ref 0–44)
AST: 14 U/L — ABNORMAL LOW (ref 15–41)
Albumin: 3.3 g/dL — ABNORMAL LOW (ref 3.5–5.0)
Alkaline Phosphatase: 59 U/L (ref 38–126)
Anion gap: 9 (ref 5–15)
BUN: 8 mg/dL (ref 6–20)
CO2: 26 mmol/L (ref 22–32)
Calcium: 8.4 mg/dL — ABNORMAL LOW (ref 8.9–10.3)
Chloride: 102 mmol/L (ref 98–111)
Creatinine, Ser: 0.85 mg/dL (ref 0.44–1.00)
GFR, Estimated: >60 mL/min (ref >60)
Glucose, Bld: 98 mg/dL (ref 70–99)
Potassium: 4.1 mmol/L (ref 3.5–5.1)
Sodium: 137 mmol/L (ref 135–145)
Total Bilirubin: 0.4 mg/dL (ref 0.3–1.2)
Total Protein: 6.7 g/dL (ref 6.5–8.1)

## 2022-02-10 LAB — CBC WITH DIFFERENTIAL/PLATELET
Abs Immature Granulocytes: 0.06 10*3/uL (ref 0.00–0.07)
Basophils Absolute: 0.1 10*3/uL (ref 0.0–0.1)
Basophils Relative: 0 %
Eosinophils Absolute: 0.1 10*3/uL (ref 0.0–0.5)
Eosinophils Relative: 1 %
HCT: 36.5 % (ref 36.0–46.0)
Hemoglobin: 12.3 g/dL (ref 12.0–15.0)
Immature Granulocytes: 0 %
Lymphocytes Relative: 22 %
Lymphs Abs: 3.5 10*3/uL (ref 0.7–4.0)
MCH: 29.8 pg (ref 26.0–34.0)
MCHC: 33.7 g/dL (ref 30.0–36.0)
MCV: 88.4 fL (ref 80.0–100.0)
Monocytes Absolute: 1.1 10*3/uL — ABNORMAL HIGH (ref 0.1–1.0)
Monocytes Relative: 7 %
Neutro Abs: 10.9 10*3/uL — ABNORMAL HIGH (ref 1.7–7.7)
Neutrophils Relative %: 70 %
Platelets: 273 10*3/uL (ref 150–400)
RBC: 4.13 MIL/uL (ref 3.87–5.11)
RDW: 12.8 % (ref 11.5–15.5)
WBC: 15.7 10*3/uL — ABNORMAL HIGH (ref 4.0–10.5)
nRBC: 0 % (ref 0.0–0.2)

## 2022-02-10 LAB — RAPID URINE DRUG SCREEN, HOSP PERFORMED
Amphetamines: NOT DETECTED
Barbiturates: NOT DETECTED
Benzodiazepines: NOT DETECTED
Cocaine: NOT DETECTED
Opiates: NOT DETECTED
Tetrahydrocannabinol: POSITIVE — AB

## 2022-02-10 LAB — I-STAT BETA HCG BLOOD, ED (MC, WL, AP ONLY): I-stat hCG, quantitative: <5 m[IU]/mL (ref <5)

## 2022-02-10 LAB — MAGNESIUM: Magnesium: 1.9 mg/dL (ref 1.7–2.4)

## 2022-02-10 LAB — CBG MONITORING, ED: Glucose-Capillary: 97 mg/dL (ref 70–99)

## 2022-02-10 MED ORDER — LACTATED RINGERS IV BOLUS
1000.0000 mL | Freq: Once | INTRAVENOUS | Status: AC
Start: 2022-02-10 — End: 2022-02-10
  Administered 2022-02-10: 1000 mL via INTRAVENOUS

## 2022-02-10 NOTE — ED Triage Notes (Signed)
Per EMS called out for seizure activity witnessed by family, pt was "twitching and jerking" EMS reports pt was Aox2 while assessing pt ems report a period of 30 seconds pt was unresponsive and episode of emesis. Pt has no seizure Hx. Per ems marijuana was used today

## 2022-02-10 NOTE — Discharge Instructions (Addendum)
Your workup today was overall reassuring.  As discussed your episode could be a seizure versus a syncopal episode.  However given the risk associated with it being a seizure it is important that you go on seizure precautions until you follow-up with neurologist.  I have given you a neurologist follow-up as listed above.  For any concerning symptoms return to the emergency department.  Otherwise please follow-up with your primary care provider.  If you do not have a primary care provider have attached information for [facility] for you to establish care with.  Continue to drink plenty of fluids.  You did receive fluid bolus in the emergency department with improvement in your symptoms.  Chest x-ray, CT head did not show any concerning findings.  Blood work overall reassuring. Seizure precautions are listed below -Patient counseled not to drive or operate heavy machinery until at least 6 months seizure/event free, per [facility]; patient is aware doing so could result in harm to self or others and legal sanctions  -Patient also counseled not to partake in any activities alone or unsupervised which could pose a danger to self or others should they have a seizure while participating in these activities (e.g. swimming, bathing, climbing ladders, cooking with open flame or hot liquids).

## 2022-02-10 NOTE — ED Provider Notes (Signed)
[place] [facility]GENCY DEPARTMENT AT [facility] Provider Note   CSN: [id number] Arrival date & time: 02/10/22  1848     History  Chief Complaint  Patient presents with   Seizures    Megan Simmons is a 27 y.o. female.  27 year old female presents today for evaluation of seizure-like activity that occurred just prior to arrival.  Patient was sitting with her sister about to eat dinner when this happened.  Patient was in the sitting position happened.  Sister states that patient stated "I forgot to grab a fork".  Before patient could stand up or move she fell to her side and started having convulsions to her left shoulder.  Did not have generalized shaking.  Patient was awake however not alert and did not engage her sister during this episode.  Following this episode she did have an episode of emesis.  She remains somewhat somnolent and confused before having another episode of loss of consciousness followed by emesis.  Following the second episode of emesis EMS arrived.  No additional episodes since then.  No prior history of seizures.  She states prior to this episode she did feel lightheaded, and felt something was off.  She states she has had these sensations in the past at which point she would sit down or lie down and the episode will resolve.  She states the episodes have never progressed to this point.  She does endorse marijuana use earlier today.  This is not new for her.  The history is provided by the patient. No language interpreter was used.       Home Medications Prior to Admission medications   Medication Sig Start Date End Date Taking? Authorizing Provider  etonogestrel (NEXPLANON) 68 MG IMPL implant 68 mg by Subdermal route once. Implanted December 2019    [provider]  ibuprofen (ADVIL) 800 MG tablet Take 1 tablet (800 mg total) by mouth 3 (three) times daily. 12/27/20   White, Elita Boone, NP  lidocaine (XYLOCAINE) 2 % solution Use as directed 15  mLs in the mouth or throat as needed for mouth pain. 12/27/20   White, Elita Boone, NP  naproxen (NAPROSYN) 500 MG tablet Take 1 tablet (500 mg total) by mouth 2 (two) times daily as needed for moderate pain. 04/21/20   Petrucelli, Pleas Koch, PA-C      Allergies    Patient has no known allergies.    Review of Systems   Review of Systems  Constitutional:  Negative for chills and fever.  Respiratory:  Negative for cough and shortness of breath.   Cardiovascular:  Negative for chest pain.  Gastrointestinal:  Positive for vomiting. Negative for abdominal pain and nausea.  Genitourinary:  Negative for dysuria.  Neurological:  Positive for seizures, syncope and light-headedness.  All other systems reviewed and are negative.   Physical Exam Updated Vital Signs BP (!) 191/112   Pulse 72   Temp 98.4 F (36.9 C) (Oral)   Resp 19   Ht 5\' 8"  (1.727 m)   Wt 117 kg   LMP 01/27/2022 (Approximate)   SpO2 100%   BMI 39.23 kg/m  Physical Exam Vitals and nursing note reviewed.  Constitutional:      General: She is not in acute distress.    Appearance: Normal appearance. She is not ill-appearing.  HENT:     Head: Normocephalic and atraumatic.     Nose: Nose normal.  Eyes:     General: No scleral icterus.  Extraocular Movements: Extraocular movements intact.     Conjunctiva/sclera: Conjunctivae normal.  Cardiovascular:     Rate and Rhythm: Normal rate and regular rhythm.     Pulses: Normal pulses.     Heart sounds: Normal heart sounds.  Pulmonary:     Effort: Pulmonary effort is normal. No respiratory distress.     Breath sounds: Normal breath sounds. No wheezing or rales.  Abdominal:     General: There is no distension.     Tenderness: There is no abdominal tenderness.  Musculoskeletal:        General: Normal range of motion.     Cervical back: Normal range of motion.  Skin:    General: Skin is warm and dry.  Neurological:     General: No focal deficit present.     Mental  Status: She is alert and oriented to person, place, and time. Mental status is at baseline.     Comments: Cranial nerves III through XII intact.  Full range of motion of bilateral upper and lower extremities with 5/5 strength of extensor and flexor muscle groups.  Without pronator drift.  Without facial asymmetry.  Normal voice.     ED Results / Procedures / Treatments   Labs (all labs ordered are listed, but only abnormal results are displayed) Labs Reviewed  CBC WITH DIFFERENTIAL/PLATELET - Abnormal; Notable for the following components:      Result Value   WBC 15.7 (*)    Neutro Abs 10.9 (*)    Monocytes Absolute 1.1 (*)    All other components within normal limits  COMPREHENSIVE METABOLIC PANEL - Abnormal; Notable for the following components:   Calcium 8.4 (*)    Albumin 3.3 (*)    AST 14 (*)    All other components within normal limits  RAPID URINE DRUG SCREEN, HOSP PERFORMED - Abnormal; Notable for the following components:   Tetrahydrocannabinol POSITIVE (*)    All other components within normal limits  MAGNESIUM  CBG MONITORING, ED  I-STAT BETA HCG BLOOD, ED ([facility], [facility], [facility] ONLY)    EKG EKG Interpretation  Date/Time:  Saturday February 10 2022 18:50:47 EST Ventricular Rate:  67 PR Interval:  175 QRS Duration: 92 QT Interval:  411 QTC Calculation: 434 R Axis:   49 Text Interpretation: Sinus rhythm Confirmed by Linwood Dibbles [phone]) on 02/10/2022 7:04:13 PM  Radiology DG Chest Portable 1 View  Result Date: 02/10/2022 CLINICAL DATA:  Clinical suspicion of pneumonia. EXAM: PORTABLE CHEST 1 VIEW COMPARISON:  PA Lat 09/30/2013 FINDINGS: There is mild cardiomegaly. The mediastinum is normally outlined. No vascular congestion is seen. Both lungs are clear. The visualized skeletal structures are unremarkable. There is a tangle of overlying monitor wires. IMPRESSION: No evidence of acute chest disease. Mild cardiomegaly. Electronically Signed   By: Almira Bar M.D.   On:  02/10/2022 20:06    Procedures Procedures    Medications Ordered in ED Medications  lactated ringers bolus 1,000 mL (1,000 mLs Intravenous New Bag/Given 02/10/22 1954)    ED Course/ Medical Decision Making/ A&P                             Medical Decision Making Amount and/or Complexity of Data Reviewed Labs: ordered. Radiology: ordered.   Medical Decision Making / ED Course   This patient presents to the ED for concern of seizure-like activity, this involves an extensive number of treatment options, and is a complaint that carries  with it a high risk of complications and morbidity.  The differential diagnosis includes syncope, seizure  MDM: 27 year old female presents today for evaluation of seizure-like activity.  Sister witnessed the episode.  She is at bedside and provides history as well.  Patient does recall some events surrounding the episode.  No definite postictal episode.  However did have convulsions however these were localized to the left shoulder.  She had 2 episodes of emesis surrounding these episodes.  She states she did have prodromal lightheadedness and just an overall feeling of not feeling well.  She states this has happened in the past however it never progressed to the convulsions or emesis.  No prior history of seizures.  CBC shows leukocytosis of 15, no significant left shift.  No anemia.  CMP shows no acute or concerning findings.  Magnesium 1.9.  hCG negative.  CBG 97.  THC detected on UDS.  Fluid bolus provided.  Patient at baseline currently. CT head obtained and shows no acute intracranial finding.  Chest x-ray without evidence of pneumonia.  EKG without acute ischemic changes.  No arrhythmia noted. No concerning etiology of patient's identified.  First seizure episode versus syncopal episode.  Seizure precautions discussed with patient and mom over the phone.  Neurology follow-up provided.  Work note given until patient follows up with  neurology.   Additional history obtained: -Additional history obtained from sister at bedside -External records from outside source obtained and reviewed including: Chart review including previous notes, labs, imaging, consultation notes   Lab Tests: -I ordered, reviewed, and interpreted labs.   The pertinent results include:   Labs Reviewed  CBC WITH DIFFERENTIAL/PLATELET - Abnormal; Notable for the following components:      Result Value   WBC 15.7 (*)    Neutro Abs 10.9 (*)    Monocytes Absolute 1.1 (*)    All other components within normal limits  COMPREHENSIVE METABOLIC PANEL - Abnormal; Notable for the following components:   Calcium 8.4 (*)    Albumin 3.3 (*)    AST 14 (*)    All other components within normal limits  RAPID URINE DRUG SCREEN, HOSP PERFORMED - Abnormal; Notable for the following components:   Tetrahydrocannabinol POSITIVE (*)    All other components within normal limits  MAGNESIUM  CBG MONITORING, ED  I-STAT BETA HCG BLOOD, ED ([facility], [facility], [facility] ONLY)      EKG  EKG Interpretation  Date/Time:  Saturday February 10 2022 18:50:47 EST Ventricular Rate:  67 PR Interval:  175 QRS Duration: 92 QT Interval:  411 QTC Calculation: 434 R Axis:   49 Text Interpretation: Sinus rhythm Confirmed by Dorie Rank [phone]) on 02/10/2022 7:04:13 PM         Imaging Studies ordered: I ordered imaging studies including CT head, chest x-ray I independently visualized and interpreted imaging. I agree with the radiologist interpretation   Medicines ordered and prescription drug management: Meds ordered this encounter  Medications   lactated ringers bolus 1,000 mL    -I have reviewed the patients home medicines and have made adjustments as needed  Critical interventions IV fluids   Cardiac Monitoring: The patient was maintained on a cardiac monitor.  I personally viewed and interpreted the cardiac monitored which showed an underlying rhythm of: Normal sinus  rhythm  Reevaluation: After the interventions noted above, I reevaluated the patient and found that they have : Improved  Co morbidities that complicate the patient evaluation  Past Medical History:  Diagnosis Date  Adjustment disorder with depressed mood 02/13/11   referred for counseling   Constipation, chronic    Depression    Hidradenitis suppurativa    Mom not totally sure but described problem consistent with this diagnosis.   Irregular menses    Pelvic inflammatory disease (PID) 10/07/2013      Dispostion: Patient is appropriate for discharge.  Discharged in stable condition.  Return precaution discussed.  Seizure precautions discussed with patient and mom over the phone.  Neurology follow-up provided.  Patient and mom both voiced understanding and are in agreement with plan.  Final Clinical Impression(s) / ED Diagnoses Final diagnoses:  Seizure-like activity ([place])    Rx / DC Orders ED Discharge Orders          Ordered    Ambulatory referral to Neurology       Comments: An appointment is requested in approximately: 1 week   02/10/22 2230              Evlyn Courier, PA-C 02/10/22 2252    Dorie Rank, MD 02/10/22 [phone]

## 2022-02-13 ENCOUNTER — Telehealth: Payer: Self-pay | Admitting: *Deleted

## 2022-02-13 NOTE — Patient Outreach (Signed)
  Care Coordination [facility] Note Transition Care Management Unsuccessful Follow-up Telephone Call  Date of discharge and from where:  02/10/22 from [facility]  Attempts:  1st Attempt  Reason for unsuccessful TCM follow-up call:  Left voice message   Lurena Joiner RN, BSN [place] RN Care Coordinator

## 2022-02-14 ENCOUNTER — Ambulatory Visit (INDEPENDENT_AMBULATORY_CARE_PROVIDER_SITE_OTHER): Payer: BC Managed Care – PPO | Admitting: Neurology

## 2022-02-14 ENCOUNTER — Encounter: Payer: Self-pay | Admitting: Neurology

## 2022-02-14 VITALS — BP 112/71 | HR 85 | Ht 67.5 in | Wt 247.8 lb

## 2022-02-14 DIAGNOSIS — R252 Cramp and spasm: Secondary | ICD-10-CM | POA: Diagnosis not present

## 2022-02-14 DIAGNOSIS — R404 Transient alteration of awareness: Secondary | ICD-10-CM

## 2022-02-14 NOTE — Patient Instructions (Signed)
Good to meet you.  Schedule MRI brain with and without contrast  2. Schedule 1-hour EEG. If normal, we will plan for a 2-day home EEG  3. Follow-up after tests, call for any changes

## 2022-02-14 NOTE — Progress Notes (Signed)
NEUROLOGY CONSULTATION NOTE  Megan Simmons MRN: [id number] DOB: Apr 02, 1995  Referring provider: Evlyn Courier, PA-C Primary care provider: none listed  Reason for consult:  seizure   Thank you for your kind referral of Megan Simmons for consultation of the above symptoms. Although her history is well known to you, please allow me to reiterate it for the purpose of our medical record. She is alone in the office today. Records and images were personally reviewed where available.   HISTORY OF PRESENT ILLNESS: This is a very pleasant 27 year old right-handed woman presenting for evaluation of seizure-like activity. She reports episodes of near syncope and syncope since age 27 or 12. She recalls the first time it happened, she was feeling lightheaded, vision blurred like static TV, then she lost consciousness. She reports that she felt it several times that morning and lost consciousness briefly but did not seek medical attention. Over the years, she would have these episodes 2-3 times a year. She recalls an episode a year ago while she was at home sitting down, she started having the same sensation and woke up in bed with urinary incontinence and vomiting. She believes each time she passed out, she would vomit when she came to. On 02/10/22, she was having dinner and felt fine. She got up to get a second helping and started feeling the same lightheaded/dizzy feeling, with vision blurry like static TV. She sat down and recalls leaning her head back telling her sister she was not feeling good, then waking up to her sister screaming at her. She felt warm and recalls vomiting. She then started feeling it again and things went black, she had lost consciousness again followed by emesis. She then woke up to EMS around her. Her sister reported she fell to her side and started having left arm jerking, eyes were wide open but she was not responding. No tongue bite or incontinence. Her whole body felt weak and  fatigued. When she tried to stand up, she got dizzy again and vomited, so EMS had to carry her out. In the ER, CBC showed a WBC of 15, CMP normal. UDS positive for THC. Head CT no acute changes, EKG NSR. She notes she had less sleep the night prior. No alcohol.  She notes that since her childhood, she has had gaps in time/loses track of time. She would be fixated and focused, which she has chalked up to her ADHD. She drives a machine at work Engineer, building [employer]) and has caught herself staring and having the remote on Go. People around her have mentioned that she would stare and not respond sometimes. She reports that she daydreams a lot and gets distracted very easily. She has had body jerks in her arms or legs and may drop things. She denies any olfactory/gustatory hallucinations, focal numbness/tingling/weakness. No headaches but since the episode she has had a little tingling straight though on her head. No diplopia, dysarthria/dysphagia, back pain, bowel/bladder dysfunction. She has a little neck pain. Memory is pretty good. She does not drive. She lives with her 2 sisters and 51 month old nephew. Her maternal uncle had seizures since childhood. She had a normal birth and early development.  There is no history of febrile convulsions, CNS infections such as meningitis/encephalitis, significant traumatic brain injury, neurosurgical procedures   PAST MEDICAL HISTORY: Past Medical History:  Diagnosis Date   Adjustment disorder with depressed mood 02/13/11   referred for counseling   Constipation, chronic  Depression    Hidradenitis suppurativa    Mom not totally sure but described problem consistent with this diagnosis.   Irregular menses    Pelvic inflammatory disease (PID) 10/07/2013    PAST SURGICAL HISTORY: Past Surgical History:  Procedure Laterality Date   I & D EXTREMITY Right 09/20/2018   Procedure: IRRIGATION AND DEBRIDEMENT INDEX FINGER;  Surgeon: Roseanne Kaufman, MD;   Location: [facility];  Service: Orthopedics;  Laterality: Right;   INCISION AND DRAINAGE ABSCESS Right 05/28/2013   Procedure: INCISION AND DRAINAGE SACRAL ABSCESS;  Surgeon: Jerilynn Mages. Gerald Stabs, MD;  Location: [facility];  Service: Pediatrics;  Laterality: Right;    MEDICATIONS: Current Outpatient Medications on File Prior to Visit  Medication Sig Dispense Refill   etonogestrel (NEXPLANON) 68 MG IMPL implant 68 mg by Subdermal route once. Implanted December 2019     No current facility-administered medications on file prior to visit.    ALLERGIES: No Known Allergies  FAMILY HISTORY: Family History  Problem Relation Age of Onset   Asthma Mother    Hypertension Mother    ADD / ADHD Mother    Eczema Mother    Diabetes Other    Hypertension Other     SOCIAL HISTORY: Social History   Socioeconomic History   Marital status: Single    Spouse name: Not on file   Number of children: Not on file   Years of education: Not on file   Highest education level: Not on file  Occupational History   Not on file  Tobacco Use   Smoking status: Light Smoker   Smokeless tobacco: Never  Vaping Use   Vaping Use: Some days  Substance and Sexual Activity   Alcohol use: Yes    Alcohol/week: 0.0 standard drinks of alcohol    Comment: OCC   Drug use: Yes    Types: Marijuana   Sexual activity: Yes    Partners: Male    Birth control/protection: Condom    Comment: 12/26/16 PROTECTED 2 DAYS AGO   Other Topics Concern   Not on file  Social History Narrative   Lives with Mom and 4 siblings   Right handed    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 [employer]   1 story just step to get into home   Social Determinants of Health   Financial Resource Strain: Not on file  Food Insecurity: Not on file  Transportation Needs: Not on file  Physical Activity: Not on file  Stress: Not on file  Social Connections: Not on file  Intimate Partner Violence: Not on file     PHYSICAL  EXAM: Vitals:   02/14/22 1009  BP: 112/71  Pulse: 85  SpO2: 99%   General: No acute distress Head:  Normocephalic/atraumatic Skin/Extremities: No rash, no edema Neurological Exam: Mental status: alert and oriented to person, place, and time, no dysarthria or aphasia, Fund of knowledge is appropriate, 3/3 delayed recall.  Recent and remote memory are intact, 5/5 WORLD backwards.  Attention and concentration are normal.    Able to name objects and repeat phrases. Cranial nerves: CN I: not tested CN II: pupils equal, round, visual fields intact CN III, IV, VI:  full range of motion, no nystagmus, no ptosis CN V: facial sensation intact CN VII: upper and lower face symmetric CN VIII: hearing intact to conversation Bulk & Tone: normal, no fasciculations. Motor: 5/5 throughout with no pronator drift. Sensation: decreased pin on left UE and LE, decreased cold on left medial ankle.  Intact vibration sense. Romberg test negative Deep Tendon Reflexes: +1 throughout Cerebellar: no incoordination on finger to nose testing Gait: narrow-based and steady, able to tandem walk adequately. Tremor: none   IMPRESSION: This is a very pleasant 27 year old right-handed woman presenting for evaluation of seizure-like activity. She reports that since childhood she has had staring episodes and body jerks,as well as episodes of lightheadedness followed by loss of consciousness. She had a different type of episode on 02/10/22 where she felt the similar lightheadedness but had left-sided jerking and staring followed by loss of consciousness x 2 with emesis after. Etiology of symptoms unclear, seizure versus convulsive syncope. Exam today shows subjective sensory changes on the left side. MRI brain with and without contrast and 1-hour EEG will be ordered. If normal, a 48-hour EEG will be done to characterize her symptoms. [place] driving laws were discussed with the patient, and she knows to stop driving after a seizure,  until 6 months seizure-free. She does not have a driver's license but operates machinery at work, we discussed work restrictions. Follow-up after tests, call for any changes.   Thank you for allowing me to participate in the care of this patient. Please do not hesitate to call for any questions or concerns.   Ellouise Newer, M.D.  CC: Evlyn Courier, [place]

## 2022-02-15 ENCOUNTER — Ambulatory Visit (INDEPENDENT_AMBULATORY_CARE_PROVIDER_SITE_OTHER): Payer: BC Managed Care – PPO | Admitting: Neurology

## 2022-02-15 DIAGNOSIS — R404 Transient alteration of awareness: Secondary | ICD-10-CM | POA: Diagnosis not present

## 2022-02-15 DIAGNOSIS — R252 Cramp and spasm: Secondary | ICD-10-CM | POA: Diagnosis not present

## 2022-02-15 NOTE — Progress Notes (Signed)
EEG complete - results pending 

## 2022-02-21 NOTE — Procedures (Signed)
ELECTROENCEPHALOGRAM REPORT  Date of Study: 02/15/2022  Patient's Name: Megan Simmons MRN: [id number] Date of Birth: Sep 11, 1995  Referring Provider: Dr. Ellouise Newer  Clinical History: This is a 27 year old woman with staring episodes and body jerks,as well as episodes of lightheadedness followed by loss of consciousness. EEG for classification.  Medications: Nexplanon  Technical Summary: A multichannel digital 1-hour EEG recording measured by the international 10-20 system with electrodes applied with paste and impedances below 5000 ohms performed in our laboratory with EKG monitoring in an awake and asleep patient.  Hyperventilation and photic stimulation were performed.  The digital EEG was referentially recorded, reformatted, and digitally filtered in a variety of bipolar and referential montages for optimal display.    Description: The patient is awake and asleep during the recording.  During maximal wakefulness, there is a symmetric, medium voltage 10.5 Hz posterior dominant rhythm that attenuates with eye opening.  The record is symmetric.  During drowsiness and sleep, there is an increase in theta slowing of the background.  Vertex waves and symmetric sleep spindles were seen. Hyperventilation and photic stimulation did not elicit any abnormalities.  There were no epileptiform discharges or electrographic seizures seen.    EKG lead was unremarkable.  Impression: This 1-hour awake and asleep EEG is normal.    Clinical Correlation: A normal EEG does not exclude a clinical diagnosis of epilepsy.  If further clinical questions remain, prolonged EEG may be helpful.  Clinical correlation is advised.   Ellouise Newer, M.D.

## 2022-02-23 ENCOUNTER — Telehealth: Payer: Self-pay

## 2022-02-23 DIAGNOSIS — R252 Cramp and spasm: Secondary | ICD-10-CM

## 2022-02-23 DIAGNOSIS — R404 Transient alteration of awareness: Secondary | ICD-10-CM

## 2022-02-23 NOTE — Telephone Encounter (Signed)
Pt called in an was informed that the EEG is normal. Proceed with brain MRI as scheduled. We had discussed doing a 2-day EEG if the office EEG is normal. If she would like to proceed, pls order 48-hour EEG. Pt would like to do the 48 hour eeg

## 2022-02-23 NOTE — Telephone Encounter (Signed)
-----   Message from Cameron Sprang, MD sent at 02/22/2022 12:25 PM EST ----- Pls let her know the EEG is normal. Proceed with brain MRI as scheduled. We had discussed doing a 2-day EEG if the office EEG is normal. If she would like to proceed, pls order 48-hour EEG, thanks

## 2022-02-27 ENCOUNTER — Telehealth: Payer: Self-pay | Admitting: *Deleted

## 2022-03-06 ENCOUNTER — Encounter: Payer: Self-pay | Admitting: Neurology

## 2022-03-08 ENCOUNTER — Telehealth: Payer: Self-pay | Admitting: *Deleted

## 2022-03-09 ENCOUNTER — Inpatient Hospital Stay: Admission: RE | Admit: 2022-03-09 | Payer: BC Managed Care – PPO | Source: Ambulatory Visit

## 2023-08-09 ENCOUNTER — Emergency Department (HOSPITAL_BASED_OUTPATIENT_CLINIC_OR_DEPARTMENT_OTHER)
Admission: EM | Admit: 2023-08-09 | Discharge: 2023-08-09 | Discharge status: Against Medical Advice | Payer: Self-pay | Attending: Emergency Medicine | Admitting: Emergency Medicine

## 2023-08-09 ENCOUNTER — Other Ambulatory Visit: Payer: Self-pay

## 2023-08-09 ENCOUNTER — Encounter (HOSPITAL_BASED_OUTPATIENT_CLINIC_OR_DEPARTMENT_OTHER): Payer: Self-pay | Admitting: Emergency Medicine

## 2023-08-09 DIAGNOSIS — S60551A Superficial foreign body of right hand, initial encounter: Secondary | ICD-10-CM | POA: Insufficient documentation

## 2023-08-09 DIAGNOSIS — Z5321 Procedure and treatment not carried out due to patient leaving prior to being seen by health care provider: Secondary | ICD-10-CM | POA: Insufficient documentation

## 2023-08-09 DIAGNOSIS — W458XXA Other foreign body or object entering through skin, initial encounter: Secondary | ICD-10-CM | POA: Insufficient documentation

## 2023-08-09 NOTE — ED Triage Notes (Signed)
 Wooden splinter from mop handle in right palm

## 2023-08-09 NOTE — ED Notes (Signed)
 Pt not in room when attempting to assess. PA unable to locate patient either. Pt LWBS
